# Patient Record
Sex: Female | Born: 1995 | Hispanic: Yes | Marital: Married | Smoking: Never smoker
Health system: Southern US, Community
[De-identification: ages and names within clinical notes are randomized; demographics above are authoritative.]

## PROBLEM LIST (undated history)

## (undated) ENCOUNTER — Inpatient Hospital Stay (HOSPITAL_COMMUNITY): Payer: Self-pay

## (undated) DIAGNOSIS — E063 Autoimmune thyroiditis: Secondary | ICD-10-CM

## (undated) DIAGNOSIS — K529 Noninfective gastroenteritis and colitis, unspecified: Secondary | ICD-10-CM

## (undated) HISTORY — DX: Noninfective gastroenteritis and colitis, unspecified: K52.9

## (undated) HISTORY — DX: Autoimmune thyroiditis: E06.3

---

## 2015-12-26 HISTORY — PX: RHINOPLASTY: SUR1284

## 2017-10-24 DIAGNOSIS — K529 Noninfective gastroenteritis and colitis, unspecified: Secondary | ICD-10-CM

## 2017-10-24 HISTORY — DX: Noninfective gastroenteritis and colitis, unspecified: K52.9

## 2018-06-07 ENCOUNTER — Ambulatory Visit (INDEPENDENT_AMBULATORY_CARE_PROVIDER_SITE_OTHER): Payer: PRIVATE HEALTH INSURANCE

## 2018-06-07 ENCOUNTER — Encounter: Payer: Self-pay | Admitting: *Deleted

## 2018-06-07 VITALS — BP 102/69 | HR 90 | Ht 61.0 in | Wt 155.7 lb

## 2018-06-07 DIAGNOSIS — Z3201 Encounter for pregnancy test, result positive: Secondary | ICD-10-CM | POA: Diagnosis not present

## 2018-06-07 DIAGNOSIS — Z3490 Encounter for supervision of normal pregnancy, unspecified, unspecified trimester: Secondary | ICD-10-CM | POA: Insufficient documentation

## 2018-06-07 LAB — POCT URINE PREGNANCY: Preg Test, Ur: POSITIVE — AB

## 2018-06-07 NOTE — Addendum Note (Signed)
Addended by: Maretta BeesMCGLASHAN, Nashali Ditmer J on: 06/07/2018 09:11 AM   Modules accepted: Level of Service

## 2018-06-07 NOTE — Progress Notes (Signed)
Presents for UPT.  UPT today is POSITIVE LMP 04/12/18  EDD 01/17/2019  [name] presents today for UPT. She has no unusual complaints. LMP: 04/12/18    OBJECTIVE: Appears well, in no apparent distress.  OB History    Gravida  2   Para      Term      Preterm      AB  1   Living  0     SAB      TAB      Ectopic      Multiple      Live Births             Home UPT Result:  Positive In-Office UPT result: POSITIVE I have reviewed the patient's medical, obstetrical, social, and family histories, and medications.   ASSESSMENT: Positive pregnancy test  PLAN Prenatal care to be completed at: [facility].

## 2018-07-01 ENCOUNTER — Ambulatory Visit (INDEPENDENT_AMBULATORY_CARE_PROVIDER_SITE_OTHER): Payer: PRIVATE HEALTH INSURANCE | Admitting: Obstetrics and Gynecology

## 2018-07-01 ENCOUNTER — Encounter: Payer: Self-pay | Admitting: Obstetrics and Gynecology

## 2018-07-01 DIAGNOSIS — Z3481 Encounter for supervision of other normal pregnancy, first trimester: Secondary | ICD-10-CM

## 2018-07-01 DIAGNOSIS — Z8639 Personal history of other endocrine, nutritional and metabolic disease: Secondary | ICD-10-CM

## 2018-07-01 DIAGNOSIS — Z113 Encounter for screening for infections with a predominantly sexual mode of transmission: Secondary | ICD-10-CM | POA: Diagnosis not present

## 2018-07-01 DIAGNOSIS — Z124 Encounter for screening for malignant neoplasm of cervix: Secondary | ICD-10-CM | POA: Diagnosis not present

## 2018-07-01 DIAGNOSIS — Z3490 Encounter for supervision of normal pregnancy, unspecified, unspecified trimester: Secondary | ICD-10-CM

## 2018-07-01 NOTE — Progress Notes (Signed)
Patient is in the office for initial ob visit, married.

## 2018-07-01 NOTE — Progress Notes (Signed)
  Subjective:    Jessica Ho is a G2P0010 Unknown being seen today for her first obstetrical visit and history of hashimoto thyroiditis and colitis.  Her obstetrical history is significant for first pregnancy. Patient does intend to breast feed. Pregnancy history fully reviewed.  Patient reports improving nausea and emesis.  Vitals:   07/01/18 0916  BP: 103/67  Pulse: 80  Weight: 157 lb 8 oz (71.4 kg)    HISTORY: OB History  Gravida Para Term Preterm AB Living  2       1 0  SAB TAB Ectopic Multiple Live Births  1            # Outcome Date GA Lbr Len/2nd Weight Sex Delivery Anes PTL Lv  2 Current           1 SAB 06/21/16 [id number]          Past Medical History:  Diagnosis Date  . Colitis 10/24/2017  . Hashimoto's disease    Past Surgical History:  Procedure Laterality Date  . RHINOPLASTY  12/2015   Family History  Problem Relation Age of Onset  . Hypotension Mother   . Hashimoto's thyroiditis Mother   . Hashimoto's thyroiditis Sister   . Hypertension Maternal Grandmother   . Anemia Maternal Grandmother      Exam    Uterus:   8-weeks  Pelvic Exam:    Perineum: Normal Perineum   Vulva: normal   Vagina:  normal mucosa, normal discharge   pH:    Cervix: nulliparous appearance and cervix is closed and long   Adnexa: no mass, fullness, tenderness   Bony Pelvis: gynecoid  System: Breast:  normal appearance, no masses or tenderness   Skin: normal coloration and turgor, no rashes    Neurologic: oriented, no focal deficits   Extremities: normal strength, tone, and muscle mass   HEENT extra ocular movement intact   Mouth/Teeth mucous membranes moist, pharynx normal without lesions and dental hygiene good   Neck supple and no masses   Cardiovascular: regular rate and rhythm   Respiratory:  appears well, vitals normal, no respiratory distress, acyanotic, normal RR, chest clear, no wheezing, crepitations, rhonchi, normal symmetric air entry   Abdomen: soft, non-tender;  bowel sounds normal; no masses,  no organomegaly   Urinary:       Assessment:    Pregnancy: G2P0010 Patient Active Problem List   Diagnosis Date Noted  . Hx of Hashimoto thyroiditis 07/01/2018  . Supervision of normal pregnancy, antepartum 06/07/2018        Plan:     Initial labs drawn. Prenatal vitamins. Problem list reviewed and updated. Genetic Screening discussed : Panorama deferred until viability ultrasound  Ultrasound discussed; fetal survey: requested. Limited bedside abdominal ultrasound demonstrates an intrauterine gestational sac with yolk sac with possible fetal pole, no cardiac activity clearly visualized. Patient scheduled for viability ultrasound and dating as findings are not consistent with [redacted] weeks gestation Patient reports diagnosed with Hashimoto's thyroiditis a year ago, not on medication- TSH ordered  Follow up in 4 weeks. 50% of 30 min visit spent on counseling and coordination of care.     Jessica Ho 07/01/2018

## 2018-07-01 NOTE — Patient Instructions (Signed)
 First Trimester of Pregnancy The first trimester of pregnancy is from week 1 until the end of week 13 (months 1 through 3). A week after a sperm fertilizes an egg, the egg will implant on the wall of the uterus. This embryo will begin to develop into a baby. Genes from you and your partner will form the baby. The female genes will determine whether the baby will be a boy or a girl. At 6-8 weeks, the eyes and face will be formed, and the heartbeat can be seen on ultrasound. At the end of 12 weeks, all the baby's organs will be formed. Now that you are pregnant, you will want to do everything you can to have a healthy baby. Two of the most important things are to get good prenatal care and to follow your health care provider's instructions. Prenatal care is all the medical care you receive before the baby's birth. This care will help prevent, find, and treat any problems during the pregnancy and childbirth. Body changes during your first trimester Your body goes through many changes during pregnancy. The changes vary from woman to woman.  You may gain or lose a couple of pounds at first.  You may feel sick to your stomach (nauseous) and you may throw up (vomit). If the vomiting is uncontrollable, call your health care provider.  You may tire easily.  You may develop headaches that can be relieved by medicines. All medicines should be approved by your health care provider.  You may urinate more often. Painful urination may mean you have a bladder infection.  You may develop heartburn as a result of your pregnancy.  You may develop constipation because certain hormones are causing the muscles that push stool through your intestines to slow down.  You may develop hemorrhoids or swollen veins (varicose veins).  Your breasts may begin to grow larger and become tender. Your nipples may stick out more, and the tissue that surrounds them (areola) may become darker.  Your gums may bleed and may be  sensitive to brushing and flossing.  Dark spots or blotches (chloasma, mask of pregnancy) may develop on your face. This will likely fade after the baby is born.  Your menstrual periods will stop.  You may have a loss of appetite.  You may develop cravings for certain kinds of food.  You may have changes in your emotions from day to day, such as being excited to be pregnant or being concerned that something may go wrong with the pregnancy and baby.  You may have more vivid and strange dreams.  You may have changes in your hair. These can include thickening of your hair, rapid growth, and changes in texture. Some women also have hair loss during or after pregnancy, or hair that feels dry or thin. Your hair will most likely return to normal after your baby is born.  What to expect at prenatal visits During a routine prenatal visit:  You will be weighed to make sure you and the baby are growing normally.  Your blood pressure will be taken.  Your abdomen will be measured to track your baby's growth.  The fetal heartbeat will be listened to between weeks 10 and 14 of your pregnancy.  Test results from any previous visits will be discussed.  Your health care provider may ask you:  How you are feeling.  If you are feeling the baby move.  If you have had any abnormal symptoms, such as leaking fluid, bleeding, severe   headaches, or abdominal cramping.  If you are using any tobacco products, including cigarettes, chewing tobacco, and electronic cigarettes.  If you have any questions.  Other tests that may be performed during your first trimester include:  Blood tests to find your blood type and to check for the presence of any previous infections. The tests will also be used to check for low iron levels (anemia) and protein on red blood cells (Rh antibodies). Depending on your risk factors, or if you previously had diabetes during pregnancy, you may have tests to check for high blood  sugar that affects pregnant women (gestational diabetes).  Urine tests to check for infections, diabetes, or protein in the urine.  An ultrasound to confirm the proper growth and development of the baby.  Fetal screens for spinal cord problems (spina bifida) and Down syndrome.  HIV (human immunodeficiency virus) testing. Routine prenatal testing includes screening for HIV, unless you choose not to have this test.  You may need other tests to make sure you and the baby are doing well.  Follow these instructions at home: Medicines  Follow your health care provider's instructions regarding medicine use. Specific medicines may be either safe or unsafe to take during pregnancy.  Take a prenatal vitamin that contains at least 600 micrograms (mcg) of folic acid.  If you develop constipation, try taking a stool softener if your health care provider approves. Eating and drinking  Eat a balanced diet that includes fresh fruits and vegetables, whole grains, good sources of protein such as meat, eggs, or tofu, and low-fat dairy. Your health care provider will help you determine the amount of weight gain that is right for you.  Avoid raw meat and uncooked cheese. These carry germs that can cause birth defects in the baby.  Eating four or five small meals rather than three large meals a day may help relieve nausea and vomiting. If you start to feel nauseous, eating a few soda crackers can be helpful. Drinking liquids between meals, instead of during meals, also seems to help ease nausea and vomiting.  Limit foods that are high in fat and processed sugars, such as fried and sweet foods.  To prevent constipation: ? Eat foods that are high in fiber, such as fresh fruits and vegetables, whole grains, and beans. ? Drink enough fluid to keep your urine clear or pale yellow. Activity  Exercise only as directed by your health care provider. Most women can continue their usual exercise routine during  pregnancy. Try to exercise for 30 minutes at least 5 days a week. Exercising will help you: ? Control your weight. ? Stay in shape. ? Be prepared for labor and delivery.  Experiencing pain or cramping in the lower abdomen or lower back is a good sign that you should stop exercising. Check with your health care provider before continuing with normal exercises.  Try to avoid standing for long periods of time. Move your legs often if you must stand in one place for a long time.  Avoid heavy lifting.  Wear low-heeled shoes and practice good posture.  You may continue to have sex unless your health care provider tells you not to. Relieving pain and discomfort  Wear a good support bra to relieve breast tenderness.  Take warm sitz baths to soothe any pain or discomfort caused by hemorrhoids. Use hemorrhoid cream if your health care provider approves.  Rest with your legs elevated if you have leg cramps or low back pain.  If you   develop varicose veins in your legs, wear support hose. Elevate your feet for 15 minutes, 3-4 times a day. Limit salt in your diet. Prenatal care  Schedule your prenatal visits by the twelfth week of pregnancy. They are usually scheduled monthly at first, then more often in the last 2 months before delivery.  Write down your questions. Take them to your prenatal visits.  Keep all your prenatal visits as told by your health care provider. This is important. Safety  Wear your seat belt at all times when driving.  Make a list of emergency phone numbers, including numbers for family, friends, the hospital, and police and fire departments. General instructions  Ask your health care provider for a referral to a local prenatal education class. Begin classes no later than the beginning of month 6 of your pregnancy.  Ask for help if you have counseling or nutritional needs during pregnancy. Your health care provider can offer advice or refer you to specialists for help  with various needs.  Do not use hot tubs, steam rooms, or saunas.  Do not douche or use tampons or scented sanitary pads.  Do not cross your legs for long periods of time.  Avoid cat litter boxes and soil used by cats. These carry germs that can cause birth defects in the baby and possibly loss of the fetus by miscarriage or stillbirth.  Avoid all smoking, herbs, alcohol, and medicines not prescribed by your health care provider. Chemicals in these products affect the formation and growth of the baby.  Do not use any products that contain nicotine or tobacco, such as cigarettes and e-cigarettes. If you need help quitting, ask your health care provider. You may receive counseling support and other resources to help you quit.  Schedule a dentist appointment. At home, brush your teeth with a soft toothbrush and be gentle when you floss. Contact a health care provider if:  You have dizziness.  You have mild pelvic cramps, pelvic pressure, or nagging pain in the abdominal area.  You have persistent nausea, vomiting, or diarrhea.  You have a bad smelling vaginal discharge.  You have pain when you urinate.  You notice increased swelling in your face, hands, legs, or ankles.  You are exposed to fifth disease or chickenpox.  You are exposed to German measles (rubella) and have never had it. Get help right away if:  You have a fever.  You are leaking fluid from your vagina.  You have spotting or bleeding from your vagina.  You have severe abdominal cramping or pain.  You have rapid weight gain or loss.  You vomit blood or material that looks like coffee grounds.  You develop a severe headache.  You have shortness of breath.  You have any kind of trauma, such as from a fall or a car accident. Summary  The first trimester of pregnancy is from week 1 until the end of week 13 (months 1 through 3).  Your body goes through many changes during pregnancy. The changes vary from  woman to woman.  You will have routine prenatal visits. During those visits, your health care provider will examine you, discuss any test results you may have, and talk with you about how you are feeling. This information is not intended to replace advice given to you by your health care provider. Make sure you discuss any questions you have with your health care provider. Document Released: 11/04/2001 Document Revised: 10/22/2016 Document Reviewed: 10/22/2016 [employer] Interactive Patient Education  2018   [employer]   Second Trimester of Pregnancy The second trimester is from week 14 through week 27 (months 4 through 6). The second trimester is often a time when you feel your best. Your body has adjusted to being pregnant, and you begin to feel better physically. Usually, morning sickness has lessened or quit completely, you may have more energy, and you may have an increase in appetite. The second trimester is also a time when the fetus is growing rapidly. At the end of the sixth month, the fetus is about 9 inches long and weighs about 1 pounds. You will likely begin to feel the baby move (quickening) between 16 and 20 weeks of pregnancy. Body changes during your second trimester Your body continues to go through many changes during your second trimester. The changes vary from woman to woman.  Your weight will continue to increase. You will notice your lower abdomen bulging out.  You may begin to get stretch marks on your hips, abdomen, and breasts.  You may develop headaches that can be relieved by medicines. The medicines should be approved by your health care provider.  You may urinate more often because the fetus is pressing on your bladder.  You may develop or continue to have heartburn as a result of your pregnancy.  You may develop constipation because certain hormones are causing the muscles that push waste through your intestines to slow down.  You may develop hemorrhoids or  swollen, bulging veins (varicose veins).  You may have back pain. This is caused by: ? Weight gain. ? Pregnancy hormones that are relaxing the joints in your pelvis. ? A shift in weight and the muscles that support your balance.  Your breasts will continue to grow and they will continue to become tender.  Your gums may bleed and may be sensitive to brushing and flossing.  Dark spots or blotches (chloasma, mask of pregnancy) may develop on your face. This will likely fade after the baby is born.  A dark line from your belly button to the pubic area (linea nigra) may appear. This will likely fade after the baby is born.  You may have changes in your hair. These can include thickening of your hair, rapid growth, and changes in texture. Some women also have hair loss during or after pregnancy, or hair that feels dry or thin. Your hair will most likely return to normal after your baby is born.  What to expect at prenatal visits During a routine prenatal visit:  You will be weighed to make sure you and the fetus are growing normally.  Your blood pressure will be taken.  Your abdomen will be measured to track your baby's growth.  The fetal heartbeat will be listened to.  Any test results from the previous visit will be discussed.  Your health care provider may ask you:  How you are feeling.  If you are feeling the baby move.  If you have had any abnormal symptoms, such as leaking fluid, bleeding, severe headaches, or abdominal cramping.  If you are using any tobacco products, including cigarettes, chewing tobacco, and electronic cigarettes.  If you have any questions.  Other tests that may be performed during your second trimester include:  Blood tests that check for: ? Low iron levels (anemia). ? High blood sugar that affects pregnant women (gestational diabetes) between 24 and 28 weeks. ? Rh antibodies. This is to check for a protein on red blood cells (Rh factor).  Urine  tests to   check for infections, diabetes, or protein in the urine.  An ultrasound to confirm the proper growth and development of the baby.  An amniocentesis to check for possible genetic problems.  Fetal screens for spina bifida and Down syndrome.  HIV (human immunodeficiency virus) testing. Routine prenatal testing includes screening for HIV, unless you choose not to have this test.  Follow these instructions at home: Medicines  Follow your health care provider's instructions regarding medicine use. Specific medicines may be either safe or unsafe to take during pregnancy.  Take a prenatal vitamin that contains at least 600 micrograms (mcg) of folic acid.  If you develop constipation, try taking a stool softener if your health care provider approves. Eating and drinking  Eat a balanced diet that includes fresh fruits and vegetables, whole grains, good sources of protein such as meat, eggs, or tofu, and low-fat dairy. Your health care provider will help you determine the amount of weight gain that is right for you.  Avoid raw meat and uncooked cheese. These carry germs that can cause birth defects in the baby.  If you have low calcium intake from food, talk to your health care provider about whether you should take a daily calcium supplement.  Limit foods that are high in fat and processed sugars, such as fried and sweet foods.  To prevent constipation: ? Drink enough fluid to keep your urine clear or pale yellow. ? Eat foods that are high in fiber, such as fresh fruits and vegetables, whole grains, and beans. Activity  Exercise only as directed by your health care provider. Most women can continue their usual exercise routine during pregnancy. Try to exercise for 30 minutes at least 5 days a week. Stop exercising if you experience uterine contractions.  Avoid heavy lifting, wear low heel shoes, and practice good posture.  A sexual relationship may be continued unless your health  care provider directs you otherwise. Relieving pain and discomfort  Wear a good support bra to prevent discomfort from breast tenderness.  Take warm sitz baths to soothe any pain or discomfort caused by hemorrhoids. Use hemorrhoid cream if your health care provider approves.  Rest with your legs elevated if you have leg cramps or low back pain.  If you develop varicose veins, wear support hose. Elevate your feet for 15 minutes, 3-4 times a day. Limit salt in your diet. Prenatal Care  Write down your questions. Take them to your prenatal visits.  Keep all your prenatal visits as told by your health care provider. This is important. Safety  Wear your seat belt at all times when driving.  Make a list of emergency phone numbers, including numbers for family, friends, the hospital, and police and fire departments. General instructions  Ask your health care provider for a referral to a local prenatal education class. Begin classes no later than the beginning of month 6 of your pregnancy.  Ask for help if you have counseling or nutritional needs during pregnancy. Your health care provider can offer advice or refer you to specialists for help with various needs.  Do not use hot tubs, steam rooms, or saunas.  Do not douche or use tampons or scented sanitary pads.  Do not cross your legs for long periods of time.  Avoid cat litter boxes and soil used by cats. These carry germs that can cause birth defects in the baby and possibly loss of the fetus by miscarriage or stillbirth.  Avoid all smoking, herbs, alcohol, and unprescribed drugs. Chemicals   in these products can affect the formation and growth of the baby.  Do not use any products that contain nicotine or tobacco, such as cigarettes and e-cigarettes. If you need help quitting, ask your health care provider.  Visit your dentist if you have not gone yet during your pregnancy. Use a soft toothbrush to brush your teeth and be gentle when  you floss. Contact a health care provider if:  You have dizziness.  You have mild pelvic cramps, pelvic pressure, or nagging pain in the abdominal area.  You have persistent nausea, vomiting, or diarrhea.  You have a bad smelling vaginal discharge.  You have pain when you urinate. Get help right away if:  You have a fever.  You are leaking fluid from your vagina.  You have spotting or bleeding from your vagina.  You have severe abdominal cramping or pain.  You have rapid weight gain or weight loss.  You have shortness of breath with chest pain.  You notice sudden or extreme swelling of your face, hands, ankles, feet, or legs.  You have not felt your baby move in over an hour.  You have severe headaches that do not go away when you take medicine.  You have vision changes. Summary  The second trimester is from week 14 through week 27 (months 4 through 6). It is also a time when the fetus is growing rapidly.  Your body goes through many changes during pregnancy. The changes vary from woman to woman.  Avoid all smoking, herbs, alcohol, and unprescribed drugs. These chemicals affect the formation and growth your baby.  Do not use any tobacco products, such as cigarettes, chewing tobacco, and e-cigarettes. If you need help quitting, ask your health care provider.  Contact your health care provider if you have any questions. Keep all prenatal visits as told by your health care provider. This is important. This information is not intended to replace advice given to you by your health care provider. Make sure you discuss any questions you have with your health care provider. Document Released: 11/04/2001 Document Revised: 12/16/2016 Document Reviewed: 12/16/2016 [employer] Interactive Patient Education  2018 [employer]   Contraception Choices Contraception, also called birth control, refers to methods or devices that prevent pregnancy. Hormonal methods Contraceptive  implant A contraceptive implant is a thin, plastic tube that contains a hormone. It is inserted into the upper part of the arm. It can remain in place for up to 3 years. Progestin-only injections Progestin-only injections are injections of progestin, a synthetic form of the hormone progesterone. They are given every 3 months by a health care provider. Birth control pills Birth control pills are pills that contain hormones that prevent pregnancy. They must be taken once a day, preferably at the same time each day. Birth control patch The birth control patch contains hormones that prevent pregnancy. It is placed on the skin and must be changed once a week for three weeks and removed on the fourth week. A prescription is needed to use this method of contraception. Vaginal ring A vaginal ring contains hormones that prevent pregnancy. It is placed in the vagina for three weeks and removed on the fourth week. After that, the process is repeated with a new ring. A prescription is needed to use this method of contraception. Emergency contraceptive Emergency contraceptives prevent pregnancy after unprotected sex. They come in pill form and can be taken up to 5 days after sex. They work best the sooner they are taken after having   sex. Most emergency contraceptives are available without a prescription. This method should not be used as your only form of birth control. Barrier methods Female condom A female condom is a thin sheath that is worn over the penis during sex. Condoms keep sperm from going inside a woman's body. They can be used with a spermicide to increase their effectiveness. They should be disposed after a single use. Female condom A female condom is a soft, loose-fitting sheath that is put into the vagina before sex. The condom keeps sperm from going inside a woman's body. They should be disposed after a single use. Diaphragm A diaphragm is a soft, dome-shaped barrier. It is inserted into the vagina  before sex, along with a spermicide. The diaphragm blocks sperm from entering the uterus, and the spermicide kills sperm. A diaphragm should be left in the vagina for 6-8 hours after sex and removed within 24 hours. A diaphragm is prescribed and fitted by a health care provider. A diaphragm should be replaced every 1-2 years, after giving birth, after gaining more than 15 lb (6.8 kg), and after pelvic surgery. Cervical cap A cervical cap is a round, soft latex or plastic cup that fits over the cervix. It is inserted into the vagina before sex, along with spermicide. It blocks sperm from entering the uterus. The cap should be left in place for 6-8 hours after sex and removed within 48 hours. A cervical cap must be prescribed and fitted by a health care provider. It should be replaced every 2 years. Sponge A sponge is a soft, circular piece of polyurethane foam with spermicide on it. The sponge helps block sperm from entering the uterus, and the spermicide kills sperm. To use it, you make it wet and then insert it into the vagina. It should be inserted before sex, left in for at least 6 hours after sex, and removed and thrown away within 30 hours. Spermicides Spermicides are chemicals that kill or block sperm from entering the cervix and uterus. They can come as a cream, jelly, suppository, foam, or tablet. A spermicide should be inserted into the vagina with an applicator at least 10-15 minutes before sex to allow time for it to work. The process must be repeated every time you have sex. Spermicides do not require a prescription. Intrauterine contraception Intrauterine device (IUD) An IUD is a T-shaped device that is put in a woman's uterus. There are two types:  Hormone IUD.This type contains progestin, a synthetic form of the hormone progesterone. This type can stay in place for 3-5 years.  Copper IUD.This type is wrapped in copper wire. It can stay in place for 10 years.  Permanent methods of  contraception Female tubal ligation In this method, a woman's fallopian tubes are sealed, tied, or blocked during surgery to prevent eggs from traveling to the uterus. Hysteroscopic sterilization In this method, a small, flexible insert is placed into each fallopian tube. The inserts cause scar tissue to form in the fallopian tubes and block them, so sperm cannot reach an egg. The procedure takes about 3 months to be effective. Another form of birth control must be used during those 3 months. Female sterilization This is a procedure to tie off the tubes that carry sperm (vasectomy). After the procedure, the man can still ejaculate fluid (semen). Natural planning methods Natural family planning In this method, a couple does not have sex on days when the woman could become pregnant. Calendar method This means keeping track of the   length of each menstrual cycle, identifying the days when pregnancy can happen, and not having sex on those days. Ovulation method In this method, a couple avoids sex during ovulation. Symptothermal method This method involves not having sex during ovulation. The woman typically checks for ovulation by watching changes in her temperature and in the consistency of cervical mucus. Post-ovulation method In this method, a couple waits to have sex until after ovulation. Summary  Contraception, also called birth control, means methods or devices that prevent pregnancy.  Hormonal methods of contraception include implants, injections, pills, patches, vaginal rings, and emergency contraceptives.  Barrier methods of contraception can include female condoms, female condoms, diaphragms, cervical caps, sponges, and spermicides.  There are two types of IUDs (intrauterine devices). An IUD can be put in a woman's uterus to prevent pregnancy for 3-5 years.  Permanent sterilization can be done through a procedure for males, females, or both.  Natural family planning methods involve  not having sex on days when the woman could become pregnant. This information is not intended to replace advice given to you by your health care provider. Make sure you discuss any questions you have with your health care provider. Document Released: 11/10/2005 Document Revised: 12/13/2016 Document Reviewed: 12/13/2016 Elsevier Interactive Patient Education  2018 [employer]   Breastfeeding Choosing to breastfeed is one of the best decisions you can make for yourself and your baby. A change in hormones during pregnancy causes your breasts to make breast milk in your milk-producing glands. Hormones prevent breast milk from being released before your baby is born. They also prompt milk flow after birth. Once breastfeeding has begun, thoughts of your baby, as well as his or her sucking or crying, can stimulate the release of milk from your milk-producing glands. Benefits of breastfeeding Research shows that breastfeeding offers many health benefits for infants and mothers. It also offers a cost-free and convenient way to feed your baby. For your baby  Your first milk (colostrum) helps your baby's digestive system to function better.  Special cells in your milk (antibodies) help your baby to fight off infections.  Breastfed babies are less likely to develop asthma, allergies, obesity, or type 2 diabetes. They are also at lower risk for sudden infant death syndrome (SIDS).  Nutrients in breast milk are better able to meet your baby's needs compared to infant formula.  Breast milk improves your baby's brain development. For you  Breastfeeding helps to create a very special bond between you and your baby.  Breastfeeding is convenient. Breast milk costs nothing and is always available at the correct temperature.  Breastfeeding helps to burn calories. It helps you to lose the weight that you gained during pregnancy.  Breastfeeding makes your uterus return faster to its size before pregnancy.  It also slows bleeding (lochia) after you give birth.  Breastfeeding helps to lower your risk of developing type 2 diabetes, osteoporosis, rheumatoid arthritis, cardiovascular disease, and breast, ovarian, uterine, and endometrial cancer later in life. Breastfeeding basics Starting breastfeeding  Find a comfortable place to sit or lie down, with your neck and back well-supported.  Place a pillow or a rolled-up blanket under your baby to bring him or her to the level of your breast (if you are seated). Nursing pillows are specially designed to help support your arms and your baby while you breastfeed.  Make sure that your baby's tummy (abdomen) is facing your abdomen.  Gently massage your breast. With your fingertips, massage from the outer edges of   your breast inward toward the nipple. This encourages milk flow. If your milk flows slowly, you may need to continue this action during the feeding.  Support your breast with 4 fingers underneath and your thumb above your nipple (make the letter "C" with your hand). Make sure your fingers are well away from your nipple and your baby's mouth.  Stroke your baby's lips gently with your finger or nipple.  When your baby's mouth is open wide enough, quickly bring your baby to your breast, placing your entire nipple and as much of the areola as possible into your baby's mouth. The areola is the colored area around your nipple. ? More areola should be visible above your baby's upper lip than below the lower lip. ? Your baby's lips should be opened and extended outward (flanged) to ensure an adequate, comfortable latch. ? Your baby's tongue should be between his or her lower gum and your breast.  Make sure that your baby's mouth is correctly positioned around your nipple (latched). Your baby's lips should create a seal on your breast and be turned out (everted).  It is common for your baby to suck about 2-3 minutes in order to start the flow of breast  milk. Latching Teaching your baby how to latch onto your breast properly is very important. An improper latch can cause nipple pain, decreased milk supply, and poor weight gain in your baby. Also, if your baby is not latched onto your nipple properly, he or she may swallow some air during feeding. This can make your baby fussy. Burping your baby when you switch breasts during the feeding can help to get rid of the air. However, teaching your baby to latch on properly is still the best way to prevent fussiness from swallowing air while breastfeeding. Signs that your baby has successfully latched onto your nipple  Silent tugging or silent sucking, without causing you pain. Infant's lips should be extended outward (flanged).  Swallowing heard between every 3-4 sucks once your milk has started to flow (after your let-down milk reflex occurs).  Muscle movement above and in front of his or her ears while sucking.  Signs that your baby has not successfully latched onto your nipple  Sucking sounds or smacking sounds from your baby while breastfeeding.  Nipple pain.  If you think your baby has not latched on correctly, slip your finger into the corner of your baby's mouth to break the suction and place it between your baby's gums. Attempt to start breastfeeding again. Signs of successful breastfeeding Signs from your baby  Your baby will gradually decrease the number of sucks or will completely stop sucking.  Your baby will fall asleep.  Your baby's body will relax.  Your baby will retain a small amount of milk in his or her mouth.  Your baby will let go of your breast by himself or herself.  Signs from you  Breasts that have increased in firmness, weight, and size 1-3 hours after feeding.  Breasts that are softer immediately after breastfeeding.  Increased milk volume, as well as a change in milk consistency and color by the fifth day of breastfeeding.  Nipples that are not sore,  cracked, or bleeding.  Signs that your baby is getting enough milk  Wetting at least 1-2 diapers during the first 24 hours after birth.  Wetting at least 5-6 diapers every 24 hours for the first week after birth. The urine should be clear or pale yellow by the age of 5   days.  Wetting 6-8 diapers every 24 hours as your baby continues to grow and develop.  At least 3 stools in a 24-hour period by the age of 5 days. The stool should be soft and yellow.  At least 3 stools in a 24-hour period by the age of 7 days. The stool should be seedy and yellow.  No loss of weight greater than 10% of birth weight during the first 3 days of life.  Average weight gain of 4-7 oz (113-198 g) per week after the age of 4 days.  Consistent daily weight gain by the age of 5 days, without weight loss after the age of 2 weeks. After a feeding, your baby may spit up a small amount of milk. This is normal. Breastfeeding frequency and duration Frequent feeding will help you make more milk and can prevent sore nipples and extremely full breasts (breast engorgement). Breastfeed when you feel the need to reduce the fullness of your breasts or when your baby shows signs of hunger. This is called "breastfeeding on demand." Signs that your baby is hungry include:  Increased alertness, activity, or restlessness.  Movement of the head from side to side.  Opening of the mouth when the corner of the mouth or cheek is stroked (rooting).  Increased sucking sounds, smacking lips, cooing, sighing, or squeaking.  Hand-to-mouth movements and sucking on fingers or hands.  Fussing or crying.  Avoid introducing a pacifier to your baby in the first 4-6 weeks after your baby is born. After this time, you may choose to use a pacifier. Research has shown that pacifier use during the first year of a baby's life decreases the risk of sudden infant death syndrome (SIDS). Allow your baby to feed on each breast as long as he or she  wants. When your baby unlatches or falls asleep while feeding from the first breast, offer the second breast. Because newborns are often sleepy in the first few weeks of life, you may need to awaken your baby to get him or her to feed. Breastfeeding times will vary from baby to baby. However, the following rules can serve as a guide to help you make sure that your baby is properly fed:  Newborns (babies 4 weeks of age or younger) may breastfeed every 1-3 hours.  Newborns should not go without breastfeeding for longer than 3 hours during the day or 5 hours during the night.  You should breastfeed your baby a minimum of 8 times in a 24-hour period.  Breast milk pumping Pumping and storing breast milk allows you to make sure that your baby is exclusively fed your breast milk, even at times when you are unable to breastfeed. This is especially important if you go back to work while you are still breastfeeding, or if you are not able to be present during feedings. Your lactation consultant can help you find a method of pumping that works best for you and give you guidelines about how long it is safe to store breast milk. Caring for your breasts while you breastfeed Nipples can become dry, cracked, and sore while breastfeeding. The following recommendations can help keep your breasts moisturized and healthy:  Avoid using soap on your nipples.  Wear a supportive bra designed especially for nursing. Avoid wearing underwire-style bras or extremely tight bras (sports bras).  Air-dry your nipples for 3-4 minutes after each feeding.  Use only cotton bra pads to absorb leaked breast milk. Leaking of breast milk between feedings is normal.    Use lanolin on your nipples after breastfeeding. Lanolin helps to maintain your skin's normal moisture barrier. Pure lanolin is not harmful (not toxic) to your baby. You may also hand express a few drops of breast milk and gently massage that milk into your nipples and  allow the milk to air-dry.  In the first few weeks after giving birth, some women experience breast engorgement. Engorgement can make your breasts feel heavy, warm, and tender to the touch. Engorgement peaks within 3-5 days after you give birth. The following recommendations can help to ease engorgement:  Completely empty your breasts while breastfeeding or pumping. You may want to start by applying warm, moist heat (in the shower or with warm, water-soaked hand towels) just before feeding or pumping. This increases circulation and helps the milk flow. If your baby does not completely empty your breasts while breastfeeding, pump any extra milk after he or she is finished.  Apply ice packs to your breasts immediately after breastfeeding or pumping, unless this is too uncomfortable for you. To do this: ? Put ice in a plastic bag. ? Place a towel between your skin and the bag. ? Leave the ice on for 20 minutes, 2-3 times a day.  Make sure that your baby is latched on and positioned properly while breastfeeding.  If engorgement persists after 48 hours of following these recommendations, contact your health care provider or a lactation consultant. Overall health care recommendations while breastfeeding  Eat 3 healthy meals and 3 snacks every day. Well-nourished mothers who are breastfeeding need an additional 450-500 calories a day. You can meet this requirement by increasing the amount of a balanced diet that you eat.  Drink enough water to keep your urine pale yellow or clear.  Rest often, relax, and continue to take your prenatal vitamins to prevent fatigue, stress, and low vitamin and mineral levels in your body (nutrient deficiencies).  Do not use any products that contain nicotine or tobacco, such as cigarettes and e-cigarettes. Your baby may be harmed by chemicals from cigarettes that pass into breast milk and exposure to secondhand smoke. If you need help quitting, ask your health care  provider.  Avoid alcohol.  Do not use illegal drugs or marijuana.  Talk with your health care provider before taking any medicines. These include over-the-counter and prescription medicines as well as vitamins and herbal supplements. Some medicines that may be harmful to your baby can pass through breast milk.  It is possible to become pregnant while breastfeeding. If birth control is desired, ask your health care provider about options that will be safe while breastfeeding your baby. Where to find more information: [employer]: www.llli.org Contact a health care provider if:  You feel like you want to stop breastfeeding or have become frustrated with breastfeeding.  Your nipples are cracked or bleeding.  Your breasts are red, tender, or warm.  You have: ? Painful breasts or nipples. ? A swollen area on either breast. ? A fever or chills. ? Nausea or vomiting. ? Drainage other than breast milk from your nipples.  Your breasts do not become full before feedings by the fifth day after you give birth.  You feel sad and depressed.  Your baby is: ? Too sleepy to eat well. ? Having trouble sleeping. ? More than 1 week old and wetting fewer than 6 diapers in a 24-hour period. ? Not gaining weight by 5 days of age.  Your baby has fewer than 3 stools in a   24-hour period.  Your baby's skin or the white parts of his or her eyes become yellow. Get help right away if:  Your baby is overly tired (lethargic) and does not want to wake up and feed.  Your baby develops an unexplained fever. Summary  Breastfeeding offers many health benefits for infant and mothers.  Try to breastfeed your infant when he or she shows early signs of hunger.  Gently tickle or stroke your baby's lips with your finger or nipple to allow the baby to open his or her mouth. Bring the baby to your breast. Make sure that much of the areola is in your baby's mouth. Offer one side and burp the  baby before you offer the other side.  Talk with your health care provider or lactation consultant if you have questions or you face problems as you breastfeed. This information is not intended to replace advice given to you by your health care provider. Make sure you discuss any questions you have with your health care provider. Document Released: 11/10/2005 Document Revised: 12/12/2016 Document Reviewed: 12/12/2016 Elsevier Interactive Patient Education  2018 [employer] Inc.  

## 2018-07-02 LAB — CYTOLOGY - PAP
CHLAMYDIA, DNA PROBE: NEGATIVE
DIAGNOSIS: NEGATIVE
NEISSERIA GONORRHEA: NEGATIVE

## 2018-07-05 ENCOUNTER — Telehealth: Payer: Self-pay

## 2018-07-05 ENCOUNTER — Other Ambulatory Visit: Payer: Self-pay | Admitting: Obstetrics & Gynecology

## 2018-07-05 DIAGNOSIS — B373 Candidiasis of vulva and vagina: Secondary | ICD-10-CM

## 2018-07-05 DIAGNOSIS — B3731 Acute candidiasis of vulva and vagina: Secondary | ICD-10-CM

## 2018-07-05 MED ORDER — TERCONAZOLE 0.8 % VA CREA: 1.0000 | TOPICAL_CREAM | Freq: Every day | VAGINAL | 0 refills | Status: DC

## 2018-07-05 NOTE — Telephone Encounter (Signed)
-----   Message from Tereso NewcomerUgonna A Anyanwu, MD sent at 07/05/2018 12:36 PM EDT ----- Recent pap showed yeast infection. Terazol was prescribed. Please inform patient of results and advise to pick up prescription.

## 2018-07-05 NOTE — Telephone Encounter (Signed)
Patient was notified of results and Rx.

## 2018-07-06 ENCOUNTER — Encounter: Payer: Self-pay | Admitting: Obstetrics & Gynecology

## 2018-07-06 DIAGNOSIS — R8271 Bacteriuria: Secondary | ICD-10-CM | POA: Insufficient documentation

## 2018-07-06 LAB — URINE CULTURE, OB REFLEX

## 2018-07-06 LAB — CULTURE, OB URINE

## 2018-07-07 ENCOUNTER — Other Ambulatory Visit: Payer: Self-pay | Admitting: Obstetrics and Gynecology

## 2018-07-07 ENCOUNTER — Ambulatory Visit (HOSPITAL_COMMUNITY)
Admission: RE | Admit: 2018-07-07 | Discharge: 2018-07-07 | Disposition: A | Discharge status: Home / Self Care | Payer: PRIVATE HEALTH INSURANCE | Source: Ambulatory Visit | Attending: Obstetrics and Gynecology | Admitting: Obstetrics and Gynecology

## 2018-07-07 DIAGNOSIS — Z3491 Encounter for supervision of normal pregnancy, unspecified, first trimester: Secondary | ICD-10-CM | POA: Diagnosis not present

## 2018-07-07 DIAGNOSIS — Z3490 Encounter for supervision of normal pregnancy, unspecified, unspecified trimester: Secondary | ICD-10-CM

## 2018-07-07 DIAGNOSIS — Z3A09 9 weeks gestation of pregnancy: Secondary | ICD-10-CM | POA: Insufficient documentation

## 2018-07-07 NOTE — Progress Notes (Signed)
Dating changed:  8/14 U/S at 4748w1d -> EDD 02/08/2019, not c/w LMP

## 2018-07-10 LAB — HEMOGLOBINOPATHY EVALUATION
HEMOGLOBIN A2 QUANTITATION: 2.2 % (ref 1.8–3.2)
HGB C: 0 %
HGB S: 0 %
HGB VARIANT: 0 %
Hemoglobin F Quantitation: 1 % (ref 0.0–2.0)
Hgb A: 96.8 % (ref 96.4–98.8)

## 2018-07-10 LAB — OBSTETRIC PANEL, INCLUDING HIV
Antibody Screen: NEGATIVE
Basophils Absolute: 0 10*3/uL (ref 0.0–0.2)
Basos: 0 %
EOS (ABSOLUTE): 0.3 10*3/uL (ref 0.0–0.4)
Eos: 3 %
HIV SCREEN 4TH GENERATION: NONREACTIVE
Hematocrit: 39.7 % (ref 34.0–46.6)
Hemoglobin: 12.5 g/dL (ref 11.1–15.9)
Hepatitis B Surface Ag: NEGATIVE
Immature Grans (Abs): 0 10*3/uL (ref 0.0–0.1)
Immature Granulocytes: 0 %
Lymphocytes Absolute: 2.7 10*3/uL (ref 0.7–3.1)
Lymphs: 24 %
MCH: 30 pg (ref 26.6–33.0)
MCHC: 31.5 g/dL (ref 31.5–35.7)
MCV: 95 fL (ref 79–97)
MONOCYTES: 7 %
Monocytes Absolute: 0.8 10*3/uL (ref 0.1–0.9)
NEUTROS ABS: 7.6 10*3/uL — AB (ref 1.4–7.0)
Neutrophils: 66 %
PLATELETS: 300 10*3/uL (ref 150–450)
RBC: 4.17 x10E6/uL (ref 3.77–5.28)
RDW: 12.9 % (ref 12.3–15.4)
RPR Ser Ql: NONREACTIVE
RUBELLA: 2.78 {index} (ref >0.99)
Rh Factor: POSITIVE
WBC: 11.4 10*3/uL — AB (ref 3.4–10.8)

## 2018-07-10 LAB — SMN1 COPY NUMBER ANALYSIS (SMA CARRIER SCREENING)

## 2018-07-10 LAB — CYSTIC FIBROSIS MUTATION 97: Interpretation: NOT DETECTED

## 2018-07-10 LAB — TSH: TSH: 2.94 u[IU]/mL (ref 0.450–4.500)

## 2018-07-29 ENCOUNTER — Ambulatory Visit (INDEPENDENT_AMBULATORY_CARE_PROVIDER_SITE_OTHER): Payer: PRIVATE HEALTH INSURANCE | Admitting: Obstetrics and Gynecology

## 2018-07-29 ENCOUNTER — Encounter: Payer: Self-pay | Admitting: Obstetrics and Gynecology

## 2018-07-29 VITALS — BP 103/68 | HR 92 | Wt 154.3 lb

## 2018-07-29 DIAGNOSIS — R8271 Bacteriuria: Secondary | ICD-10-CM

## 2018-07-29 DIAGNOSIS — Z3490 Encounter for supervision of normal pregnancy, unspecified, unspecified trimester: Secondary | ICD-10-CM

## 2018-07-29 MED ORDER — BUTALBITAL-APAP-CAFFEINE 50-325-40 MG PO TABS: 1.0000 | ORAL_TABLET | Freq: Four times a day (QID) | ORAL | 0 refills | Status: AC | PRN

## 2018-07-29 NOTE — Progress Notes (Signed)
   PRENATAL VISIT NOTE  Subjective:  Jessica Ho is a 22 y.o. G2P0010 at [redacted]w[redacted]d being seen today for ongoing prenatal care.  She is currently monitored for the following issues for this low-risk pregnancy and has Supervision of normal pregnancy, antepartum; Hx of Hashimoto thyroiditis; and Group B streptococcal bacteriuria on their problem list.  Patient reports no complaints.  Contractions: Not present. Vag. Bleeding: None.  Movement: Present. Denies leaking of fluid.   The following portions of the patient's history were reviewed and updated as appropriate: allergies, current medications, past family history, past medical history, past social history, past surgical history and problem list. Problem list updated.  Objective:   Vitals:   07/29/18 0816  BP: 103/68  Pulse: 92  Weight: 154 lb 4.8 oz (70 kg)    Fetal Status: Fetal Heart Rate (bpm): 164   Movement: Present     General:  Alert, oriented and cooperative. Patient is in no acute distress.  Skin: Skin is warm and dry. No rash noted.   Cardiovascular: Normal heart rate noted  Respiratory: Normal respiratory effort, no problems with respiration noted  Abdomen: Soft, gravid, appropriate for gestational age.  Pain/Pressure: Absent     Pelvic: Cervical exam deferred        Extremities: Normal range of motion.  Edema: Trace  Mental Status: Normal mood and affect. Normal behavior. Normal judgment and thought content.   Assessment and Plan:  Pregnancy: G2P0010 at [redacted]w[redacted]d  1. Encounter for supervision of normal pregnancy, antepartum, unspecified gravidity Patient is doing well without complaints Panorama today Anatomy ultrasound ordered  2. Group B streptococcal bacteriuria Will receive prophylaxis in labor  Preterm labor symptoms and general obstetric precautions including but not limited to vaginal bleeding, contractions, leaking of fluid and fetal movement were reviewed in detail with the patient. Please refer to After Visit  Summary for other counseling recommendations.  No follow-ups on file.  No future appointments.  Catalina Antigua, MD

## 2018-08-01 LAB — CULTURE, OB URINE

## 2018-08-01 LAB — URINE CULTURE, OB REFLEX

## 2018-08-04 ENCOUNTER — Encounter: Payer: Self-pay | Admitting: Obstetrics and Gynecology

## 2018-08-11 ENCOUNTER — Telehealth: Payer: Self-pay

## 2018-08-11 NOTE — Telephone Encounter (Signed)
TC to pt regarding message  Pt had concerns for OTC safe Cold Rx.  Pt advised of medications to take per NOB booklet.

## 2018-08-21 ENCOUNTER — Inpatient Hospital Stay (HOSPITAL_BASED_OUTPATIENT_CLINIC_OR_DEPARTMENT_OTHER): Payer: PRIVATE HEALTH INSURANCE

## 2018-08-21 ENCOUNTER — Encounter (HOSPITAL_COMMUNITY): Payer: Self-pay | Admitting: *Deleted

## 2018-08-21 ENCOUNTER — Inpatient Hospital Stay (HOSPITAL_COMMUNITY)
Admission: AD | Admit: 2018-08-21 | Discharge: 2018-08-21 | Disposition: A | Discharge status: Hospice | Payer: PRIVATE HEALTH INSURANCE | Source: Ambulatory Visit | Attending: Family Medicine | Admitting: Family Medicine

## 2018-08-21 DIAGNOSIS — R109 Unspecified abdominal pain: Secondary | ICD-10-CM | POA: Diagnosis not present

## 2018-08-21 DIAGNOSIS — O209 Hemorrhage in early pregnancy, unspecified: Secondary | ICD-10-CM | POA: Insufficient documentation

## 2018-08-21 DIAGNOSIS — R1032 Left lower quadrant pain: Secondary | ICD-10-CM | POA: Diagnosis not present

## 2018-08-21 DIAGNOSIS — O26892 Other specified pregnancy related conditions, second trimester: Secondary | ICD-10-CM

## 2018-08-21 DIAGNOSIS — O4692 Antepartum hemorrhage, unspecified, second trimester: Secondary | ICD-10-CM

## 2018-08-21 DIAGNOSIS — Z3A15 15 weeks gestation of pregnancy: Secondary | ICD-10-CM | POA: Diagnosis not present

## 2018-08-21 LAB — URINALYSIS, ROUTINE W REFLEX MICROSCOPIC
Bacteria, UA: NONE SEEN
Bilirubin Urine: NEGATIVE
Glucose, UA: NEGATIVE mg/dL
Ketones, ur: NEGATIVE mg/dL
Leukocytes, UA: NEGATIVE
Nitrite: NEGATIVE
Protein, ur: NEGATIVE mg/dL
Specific Gravity, Urine: 1.005 (ref 1.005–1.030)
pH: 7 (ref 5.0–8.0)

## 2018-08-21 NOTE — [facility] Note (Signed)
Jessica Ho is a 22 y.o. at [redacted]w[redacted]d here in [facility] reporting: +vaginal bleeding; started this am but spotting. Around 2pm bleeding increased to "day 3-4 of a period like'. Called the on call and they told her to come .  +lower left abdominal pain. Sharp stabbing pain. Started 5 days ago. constant Pain score: 6/10. Has not taken any medications Vitals:   08/21/18 1642  BP: 112/72  Pulse: 86  Resp: 16  Temp: 97.7 F (36.5 C)  SpO2: 100%    FHT:156 Lab orders placed from triage: ua

## 2018-08-21 NOTE — Discharge Instructions (Signed)
Vaginal Bleeding During Pregnancy, Second Trimester °A small amount of bleeding (spotting) from the vagina is relatively common in pregnancy. It usually stops on its own. Various things can cause bleeding or spotting in pregnancy. Some bleeding may be related to the pregnancy, and some may not. Sometimes the bleeding is normal and is not a problem. However, bleeding can also be a sign of something serious. Be sure to tell your health care provider about any vaginal bleeding right away. °Some possible causes of vaginal bleeding during the second trimester include: °· Infection, inflammation, or growths on the cervix. °· The placenta may be partially or completely covering the opening of the cervix inside the uterus (placenta previa). °· The placenta may have separated from the uterus (abruption of the placenta). °· You may be having early (preterm) labor. °· The cervix may not be strong enough to keep a baby inside the uterus (cervical insufficiency). °· Tiny cysts may have developed in the uterus instead of pregnancy tissue (molar pregnancy). ° °Follow these instructions at home: °Watch your condition for any changes. The following actions may help to lessen any discomfort you are feeling: °· Follow your health care provider's instructions for limiting your activity. If your health care provider orders bed rest, you may need to stay in bed and only get up to use the bathroom. However, your health care provider may allow you to continue light activity. °· If needed, make plans for someone to help with your regular activities and responsibilities while you are on bed rest. °· Keep track of the number of pads you use each day, how often you change pads, and how soaked (saturated) they are. Write this down. °· Do not use tampons. Do not douche. °· Do not have sexual intercourse or orgasms until approved by your health care provider. °· If you pass any tissue from your vagina, save the tissue so you can show it to your  health care provider. °· Only take over-the-counter or prescription medicines as directed by your health care provider. °· Do not take aspirin because it can make you bleed. °· Do not exercise or perform any strenuous activities or heavy lifting without your health care provider's permission. °· Keep all follow-up appointments as directed by your health care provider. ° °Contact a health care provider if: °· You have any vaginal bleeding during any part of your pregnancy. °· You have cramps or labor pains. °· You have a fever, not controlled by medicine. °Get help right away if: °· You have severe cramps in your back or belly (abdomen). °· You have contractions. °· You have chills. °· You pass large clots or tissue from your vagina. °· Your bleeding increases. °· You feel light-headed or weak, or you have fainting episodes. °· You are leaking fluid or have a gush of fluid from your vagina. °This information is not intended to replace advice given to you by your health care provider. Make sure you discuss any questions you have with your health care provider. °Document Released: 08/20/2005 Document Revised: 04/17/2016 Document Reviewed: 07/18/2013 °Elsevier Interactive Patient Education © 2018 [employer] ° °

## 2018-08-21 NOTE — MAU Provider Note (Signed)
Chief Complaint: Vaginal Bleeding and Abdominal Pain   First Provider Initiated Contact with Patient 08/21/18 1725     SUBJECTIVE HPI: Jessica Ho is a 22 y.o. G2P0010 at [redacted]w[redacted]d who presents to [facility] reporting vaginal bleeding. Reports seeing pink spotting on toilet paper earlier today. Later in the day became heavier. Didn't wear pad. No clots. Reports LLQ pain that is sharp. Has hx of colitis vs crohns & reports this LLQ pain is normal for her. Has had constipation recently & feels like that has made pain worse. Last BM was this morning. No fever or blood in stool.  No LOF or recent intercourse.   Location: abdomen Quality: sharp Severity: 6/10 on pain scale Duration: 5 days Timing: constant Modifying factors: none Associated signs and symptoms: constipation, vaginal bleeding  Past Medical History:  Diagnosis Date  . Colitis 10/24/2017  . Hashimoto's disease    OB History  Gravida Para Term Preterm AB Living  2       1 0  SAB TAB Ectopic Multiple Live Births  1            # Outcome Date GA Lbr Len/2nd Weight Sex Delivery Anes PTL Lv  2 Current           1 SAB 06/21/16 [redacted]w[redacted]d          Past Surgical History:  Procedure Laterality Date  . RHINOPLASTY  12/2015   Social History   Socioeconomic History  . Marital status: Married    Spouse name: Not on file  . Number of children: Not on file  . Years of education: Not on file  . Highest education level: Not on file  Occupational History  . Not on file  Social Needs  . Financial resource strain: Not on file  . Food insecurity:    Worry: Not on file    Inability: Not on file  . Transportation needs:    Medical: Not on file    Non-medical: Not on file  Tobacco Use  . Smoking status: Never Smoker  . Smokeless tobacco: Never Used  Substance and Sexual Activity  . Alcohol use: Not Currently    Comment: socially  . Drug use: Never  . Sexual activity: Yes    Birth control/protection: None  Lifestyle  .  Physical activity:    Days per week: Not on file    Minutes per session: Not on file  . Stress: Not on file  Relationships  . Social connections:    Talks on phone: Not on file    Gets together: Not on file    Attends religious service: Not on file    Active member of club or organization: Not on file    Attends meetings of clubs or organizations: Not on file    Relationship status: Not on file  . Intimate partner violence:    Fear of current or ex partner: Not on file    Emotionally abused: Not on file    Physically abused: Not on file    Forced sexual activity: Not on file  Other Topics Concern  . Not on file  Social History Narrative  . Not on file   Family History  Problem Relation Age of Onset  . Hypotension Mother   . Hashimoto's thyroiditis Mother   . Hashimoto's thyroiditis Sister   . Hypertension Maternal Grandmother   . Anemia Maternal Grandmother    No current facility-administered medications on file prior to encounter.    Current Outpatient  Medications on File Prior to Encounter  Medication Sig Dispense Refill  . butalbital-acetaminophen-caffeine (FIORICET, ESGIC) 50-325-40 MG tablet Take 1-2 tablets by mouth every 6 (six) hours as needed for headache. 20 tablet 0  . Prenatal Vit-Fe Fumarate-FA (PRENATAL MULTIVITAMIN) TABS tablet Take 1 tablet by mouth daily at 12 noon.    Marland Kitchen terconazole (TERAZOL 3) 0.8 % vaginal cream Place 1 applicator vaginally at bedtime. Apply nightly for three nights. (Patient not taking: Reported on 07/29/2018) 20 g 0   Allergies  Allergen Reactions  . Oregano [Origanum Oil] Hives    I have reviewed patient's Past Medical Hx, Surgical Hx, Family Hx, Social Hx, medications and allergies.   Review of Systems  Constitutional: Negative.   Gastrointestinal: Positive for abdominal pain and constipation. Negative for anal bleeding, blood in stool, diarrhea, nausea and vomiting.  Genitourinary: Positive for vaginal bleeding. Negative for  dysuria and vaginal discharge.    OBJECTIVE Patient Vitals for the past 24 hrs:  BP Temp Temp src Pulse Resp SpO2 Weight  08/21/18 1642 112/72 97.7 F (36.5 C) Oral 86 16 100 % -  08/21/18 1633 - - - - - - 69.9 kg   Constitutional: Well-developed, well-nourished female in no acute distress.  Cardiovascular: normal rate & rhythm, no murmur Respiratory: normal rate and effort. Lung sounds clear throughout GI: Abd soft, non-tender, Pos BS x 4. No guarding or rebound tenderness MS: Extremities nontender, no edema, normal ROM Neurologic: Alert and oriented x 4.  GU:     SPECULUM EXAM: NEFG, physiologic discharge, no blood noted, Cervical ectropion.   BIMANUAL: No CMT. cervix closed    LAB RESULTS Results for orders placed or performed during the hospital encounter of 08/21/18 (from the past 24 hour(s))  Urinalysis, Routine w reflex microscopic     Status: Abnormal   Collection Time: 08/21/18  4:38 PM  Result Value Ref Range   Color, Urine STRAW (A) YELLOW   APPearance CLEAR CLEAR   Specific Gravity, Urine 1.005 1.005 - 1.030   pH 7.0 5.0 - 8.0   Glucose, UA NEGATIVE NEGATIVE mg/dL   Hgb urine dipstick SMALL (A) NEGATIVE   Bilirubin Urine NEGATIVE NEGATIVE   Ketones, ur NEGATIVE NEGATIVE mg/dL   Protein, ur NEGATIVE NEGATIVE mg/dL   Nitrite NEGATIVE NEGATIVE   Leukocytes, UA NEGATIVE NEGATIVE   WBC, UA 0-5 0 - 5 WBC/hpf   Bacteria, UA NONE SEEN NONE SEEN   Squamous Epithelial / LPF 0-5 0 - 5    IMAGING No results found.  MAU COURSE Orders Placed This Encounter  Procedures  . Urinalysis, Routine w reflex microscopic   No orders of the defined types were placed in this encounter.   MDM FHT 156 by doppler No blood on exam & cervix closed Rh positive Abdomen soft & non tender. Afebrile NAD, VSS  ASSESSMENT 1. [redacted] weeks gestation of pregnancy   2. Vaginal bleeding in pregnancy, second trimester   3. Abdominal pain during pregnancy, second trimester      PLAN Discharge home in stable condition. Bleeding precautions  Allergies as of 08/21/2018      Reactions   Oregano [origanum Oil] Hives      Medication List    STOP taking these medications   terconazole 0.8 % vaginal cream Commonly known as:  TERAZOL 3     TAKE these medications   butalbital-acetaminophen-caffeine 50-325-40 MG tablet Commonly known as:  FIORICET, ESGIC Take 1-2 tablets by mouth every 6 (six) hours as needed for headache.  prenatal multivitamin Tabs tablet Take 1 tablet by mouth daily at 12 noon.        Judeth Horn, NP 08/21/2018  5:25 PM

## 2018-08-30 DIAGNOSIS — R8271 Bacteriuria: Secondary | ICD-10-CM

## 2018-08-30 DIAGNOSIS — Z8639 Personal history of other endocrine, nutritional and metabolic disease: Secondary | ICD-10-CM

## 2018-09-16 ENCOUNTER — Other Ambulatory Visit (HOSPITAL_COMMUNITY): Payer: Self-pay | Admitting: *Deleted

## 2018-09-16 ENCOUNTER — Ambulatory Visit (HOSPITAL_COMMUNITY)
Admission: RE | Admit: 2018-09-16 | Discharge: 2018-09-16 | Disposition: A | Discharge status: Home / Self Care | Payer: PRIVATE HEALTH INSURANCE | Source: Ambulatory Visit | Attending: Obstetrics and Gynecology | Admitting: Obstetrics and Gynecology

## 2018-09-16 DIAGNOSIS — O99282 Endocrine, nutritional and metabolic diseases complicating pregnancy, second trimester: Secondary | ICD-10-CM | POA: Diagnosis not present

## 2018-09-16 DIAGNOSIS — Z362 Encounter for other antenatal screening follow-up: Secondary | ICD-10-CM

## 2018-09-16 DIAGNOSIS — Z3A19 19 weeks gestation of pregnancy: Secondary | ICD-10-CM | POA: Diagnosis not present

## 2018-09-16 DIAGNOSIS — E079 Disorder of thyroid, unspecified: Secondary | ICD-10-CM | POA: Diagnosis not present

## 2018-09-16 DIAGNOSIS — O99283 Endocrine, nutritional and metabolic diseases complicating pregnancy, third trimester: Secondary | ICD-10-CM

## 2018-09-16 DIAGNOSIS — Z363 Encounter for antenatal screening for malformations: Secondary | ICD-10-CM | POA: Insufficient documentation

## 2018-09-23 ENCOUNTER — Encounter: Payer: Self-pay | Admitting: Obstetrics and Gynecology

## 2018-09-23 ENCOUNTER — Ambulatory Visit (INDEPENDENT_AMBULATORY_CARE_PROVIDER_SITE_OTHER): Payer: PRIVATE HEALTH INSURANCE | Admitting: Obstetrics and Gynecology

## 2018-09-23 VITALS — BP 92/60 | HR 93 | Wt 156.8 lb

## 2018-09-23 DIAGNOSIS — Z8639 Personal history of other endocrine, nutritional and metabolic disease: Secondary | ICD-10-CM

## 2018-09-23 DIAGNOSIS — R8271 Bacteriuria: Secondary | ICD-10-CM

## 2018-09-23 DIAGNOSIS — Z3490 Encounter for supervision of normal pregnancy, unspecified, unspecified trimester: Secondary | ICD-10-CM

## 2018-09-23 NOTE — Progress Notes (Signed)
   PRENATAL VISIT NOTE  Subjective:  Jessica Ho is a 22 y.o. G2P0010 at [redacted]w[redacted]d being seen today for ongoing prenatal care.  She is currently monitored for the following issues for this low-risk pregnancy and has Supervision of normal pregnancy, antepartum; Hx of Hashimoto thyroiditis; and Group B streptococcal bacteriuria on their problem list.  Patient reports itching on breasts and arms where she has an ezcema outbreak.  Contractions: Not present. Vag. Bleeding: None.  Movement: Present. Denies leaking of fluid.   The following portions of the patient's history were reviewed and updated as appropriate: allergies, current medications, past family history, past medical history, past social history, past surgical history and problem list. Problem list updated.  Objective:   Vitals:   09/23/18 0831  BP: 92/60  Pulse: 93  Weight: 156 lb 12.8 oz (71.1 kg)    Fetal Status: Fetal Heart Rate (bpm): 158   Movement: Present     General:  Alert, oriented and cooperative. Patient is in no acute distress.  Skin: Skin is warm and dry. No rash noted.   Cardiovascular: Normal heart rate noted  Respiratory: Normal respiratory effort, no problems with respiration noted  Abdomen: Soft, gravid, appropriate for gestational age.  Pain/Pressure: Present     Pelvic: Cervical exam deferred        Extremities: Normal range of motion.  Edema: Trace  Mental Status: Normal mood and affect. Normal behavior. Normal judgment and thought content.   Assessment and Plan:  Pregnancy: G2P0010 at [redacted]w[redacted]d  1. Encounter for supervision of normal pregnancy, antepartum, unspecified gravidity  2. Group B streptococcal bacteriuria ppx in labor  3. Hx of Hashimoto thyroiditis Repeat with 28 week labs Repeat growth 10/14/18  4. ezcema Reviewed to use unscented lotions, creams Will send topical steroid if worsens  Preterm labor symptoms and general obstetric precautions including but not limited to vaginal bleeding,  contractions, leaking of fluid and fetal movement were reviewed in detail with the patient. Please refer to After Visit Summary for other counseling recommendations.  Return in about 8 weeks (around 11/18/2018) for OB visit, 3rd trim labs, 2 hr GTT.  Future Appointments  Date Time Provider Department Center  10/14/2018  7:45 AM [facility] [place] 2 [facility]    Conan Bowens, MD

## 2018-09-23 NOTE — Patient Instructions (Signed)

## 2018-10-12 ENCOUNTER — Other Ambulatory Visit: Payer: Self-pay | Admitting: Obstetrics & Gynecology

## 2018-10-12 MED ORDER — TRIAMCINOLONE ACETONIDE 0.1 % EX CREA: TOPICAL_CREAM | CUTANEOUS | 0 refills | Status: DC

## 2018-10-12 NOTE — Progress Notes (Signed)
Triamcinolone cream prescribed.  See med rec.  It may not have been successfully e-prescribed.  RN to call pharmacy and make sure that it successfully went through.  Notify patient that rx was sent in.

## 2018-10-14 ENCOUNTER — Encounter (HOSPITAL_COMMUNITY): Payer: Self-pay

## 2018-10-14 ENCOUNTER — Ambulatory Visit (HOSPITAL_COMMUNITY)
Admission: RE | Admit: 2018-10-14 | Discharge: 2018-10-14 | Disposition: A | Discharge status: Home / Self Care | Payer: PRIVATE HEALTH INSURANCE | Source: Ambulatory Visit | Attending: Obstetrics and Gynecology | Admitting: Obstetrics and Gynecology

## 2018-10-14 ENCOUNTER — Telehealth: Payer: Self-pay

## 2018-10-14 DIAGNOSIS — O99282 Endocrine, nutritional and metabolic diseases complicating pregnancy, second trimester: Secondary | ICD-10-CM | POA: Diagnosis not present

## 2018-10-14 DIAGNOSIS — Z362 Encounter for other antenatal screening follow-up: Secondary | ICD-10-CM | POA: Diagnosis not present

## 2018-10-14 DIAGNOSIS — Z3A23 23 weeks gestation of pregnancy: Secondary | ICD-10-CM | POA: Insufficient documentation

## 2018-10-14 DIAGNOSIS — E079 Disorder of thyroid, unspecified: Secondary | ICD-10-CM | POA: Insufficient documentation

## 2018-10-14 NOTE — Telephone Encounter (Signed)
Contacted pt to advise that we have not received any recent BP readings through babyrx. Pt advised that she still wants to continue with babyscripts and will take BP today, and continue as scheduled.

## 2018-10-14 NOTE — ED Notes (Signed)
Pt reports leaking small amt of clear watery fluid x 2 weeks.

## 2018-11-18 ENCOUNTER — Other Ambulatory Visit: Payer: PRIVATE HEALTH INSURANCE

## 2018-11-18 ENCOUNTER — Ambulatory Visit (INDEPENDENT_AMBULATORY_CARE_PROVIDER_SITE_OTHER): Payer: PRIVATE HEALTH INSURANCE | Admitting: Obstetrics and Gynecology

## 2018-11-18 ENCOUNTER — Encounter: Payer: Self-pay | Admitting: Obstetrics and Gynecology

## 2018-11-18 VITALS — BP 97/68 | HR 106 | Wt 163.0 lb

## 2018-11-18 DIAGNOSIS — Z23 Encounter for immunization: Secondary | ICD-10-CM

## 2018-11-18 DIAGNOSIS — Z113 Encounter for screening for infections with a predominantly sexual mode of transmission: Secondary | ICD-10-CM

## 2018-11-18 DIAGNOSIS — Z8639 Personal history of other endocrine, nutritional and metabolic disease: Secondary | ICD-10-CM

## 2018-11-18 DIAGNOSIS — Z3483 Encounter for supervision of other normal pregnancy, third trimester: Secondary | ICD-10-CM

## 2018-11-18 DIAGNOSIS — B373 Candidiasis of vulva and vagina: Secondary | ICD-10-CM

## 2018-11-18 DIAGNOSIS — R8271 Bacteriuria: Secondary | ICD-10-CM

## 2018-11-18 DIAGNOSIS — Z3480 Encounter for supervision of other normal pregnancy, unspecified trimester: Secondary | ICD-10-CM

## 2018-11-18 DIAGNOSIS — N898 Other specified noninflammatory disorders of vagina: Secondary | ICD-10-CM

## 2018-11-18 MED ORDER — CLOTRIMAZOLE 1 % VA CREA: 1.0000 | TOPICAL_CREAM | Freq: Every day | VAGINAL | 2 refills | Status: AC

## 2018-11-18 NOTE — Progress Notes (Signed)
   PRENATAL VISIT NOTE  Subjective:  Jessica Ho is a 22 y.o. G2P0010 at 2992w2d being seen today for ongoing prenatal care.  She is currently monitored for the following issues for this low-risk pregnancy and has Supervision of normal pregnancy, antepartum; Hx of Hashimoto thyroiditis; and Group B streptococcal bacteriuria on their problem list.  Patient reports no complaints.  Contractions: Not present. Vag. Bleeding: None.  Movement: Present. Denies leaking of fluid. Some clumpy discharge.   The following portions of the patient's history were reviewed and updated as appropriate: allergies, current medications, past family history, past medical history, past social history, past surgical history and problem list. Problem list updated.  Objective:   Vitals:   11/18/18 0829  BP: 97/68  Pulse: (!) 106  Weight: 163 lb (73.9 kg)    Fetal Status: Fetal Heart Rate (bpm): 150   Movement: Present     General:  Alert, oriented and cooperative. Patient is in no acute distress.  Skin: Skin is warm and dry. No rash noted.   Cardiovascular: Normal heart rate noted  Respiratory: Normal respiratory effort, no problems with respiration noted  Abdomen: Soft, gravid, appropriate for gestational age.  Pain/Pressure: Present     Pelvic: Cervical exam deferred        SSE: clumpy white discharge in vault  Extremities: Normal range of motion.     Mental Status: Normal mood and affect. Normal behavior. Normal judgment and thought content.   Assessment and Plan:  Pregnancy: G2P0010 at 5092w2d  1. Supervision of other normal pregnancy, antepartum - Glucose Tolerance, 2 Hours w/1 Hour - CBC - HIV Antibody (routine testing w rflx) - RPR - Tdap today - undecided about contraception  2. Hx of Hashimoto thyroiditis - TSH  3. Group B streptococcal bacteriuria ppx in labor  4. Vaginal discharge Swab sent today Appearance of yeast infection, cream sent to pharmacy  Patient[place] of [place], she is unsure about whether to move with him or stay for the duration of pregnancy. Reviewed risks/benefits, she will consider.   Preterm labor symptoms and general obstetric precautions including but not limited to vaginal bleeding, contractions, leaking of fluid and fetal movement were reviewed in detail with the patient. Please refer to After Visit Summary for other counseling recommendations.  Return in about 4 weeks (around 12/16/2018) for OB visit.  No future appointments.  Conan BowensKelly M Davis, MD

## 2018-11-19 LAB — CERVICOVAGINAL ANCILLARY ONLY
BACTERIAL VAGINITIS: NEGATIVE
Candida vaginitis: POSITIVE — AB
Chlamydia: NEGATIVE
Neisseria Gonorrhea: NEGATIVE
Trichomonas: NEGATIVE

## 2018-11-19 LAB — CBC
Hematocrit: 34 % (ref 34.0–46.6)
Hemoglobin: 11.4 g/dL (ref 11.1–15.9)
MCH: 31.3 pg (ref 26.6–33.0)
MCHC: 33.5 g/dL (ref 31.5–35.7)
MCV: 93 fL (ref 79–97)
PLATELETS: 244 10*3/uL (ref 150–450)
RBC: 3.64 x10E6/uL — ABNORMAL LOW (ref 3.77–5.28)
RDW: 12.7 % (ref 12.3–15.4)
WBC: 12.7 10*3/uL — ABNORMAL HIGH (ref 3.4–10.8)

## 2018-11-19 LAB — GLUCOSE TOLERANCE, 2 HOURS W/ 1HR
GLUCOSE, FASTING: 81 mg/dL (ref 65–91)
Glucose, 1 hour: 117 mg/dL (ref 65–179)
Glucose, 2 hour: 114 mg/dL (ref 65–152)

## 2018-11-19 LAB — TSH: TSH: 2.11 u[IU]/mL (ref 0.450–4.500)

## 2018-11-19 LAB — RPR: RPR Ser Ql: NONREACTIVE

## 2018-11-19 LAB — HIV ANTIBODY (ROUTINE TESTING W REFLEX): HIV Screen 4th Generation wRfx: NONREACTIVE

## 2018-12-02 ENCOUNTER — Other Ambulatory Visit: Payer: Self-pay | Admitting: Obstetrics & Gynecology

## 2018-12-03 ENCOUNTER — Telehealth: Payer: Self-pay

## 2018-12-03 NOTE — Telephone Encounter (Signed)
Contacted pt and left vm to remind her to take BP for babyscripts.

## 2018-12-04 ENCOUNTER — Other Ambulatory Visit: Payer: Self-pay

## 2018-12-04 ENCOUNTER — Inpatient Hospital Stay (HOSPITAL_COMMUNITY)
Admission: AD | Admit: 2018-12-04 | Discharge: 2018-12-04 | Disposition: A | Discharge status: Home / Self Care | Payer: BLUE CROSS/BLUE SHIELD | Source: Ambulatory Visit | Attending: Family Medicine | Admitting: Family Medicine

## 2018-12-04 ENCOUNTER — Encounter (HOSPITAL_COMMUNITY): Payer: Self-pay | Admitting: *Deleted

## 2018-12-04 DIAGNOSIS — Z3A30 30 weeks gestation of pregnancy: Secondary | ICD-10-CM | POA: Diagnosis not present

## 2018-12-04 DIAGNOSIS — O26893 Other specified pregnancy related conditions, third trimester: Secondary | ICD-10-CM | POA: Diagnosis not present

## 2018-12-04 DIAGNOSIS — I959 Hypotension, unspecified: Secondary | ICD-10-CM | POA: Diagnosis not present

## 2018-12-04 DIAGNOSIS — R102 Pelvic and perineal pain: Secondary | ICD-10-CM | POA: Diagnosis not present

## 2018-12-04 DIAGNOSIS — N949 Unspecified condition associated with female genital organs and menstrual cycle: Secondary | ICD-10-CM

## 2018-12-04 DIAGNOSIS — O2653 Maternal hypotension syndrome, third trimester: Secondary | ICD-10-CM

## 2018-12-04 DIAGNOSIS — R109 Unspecified abdominal pain: Secondary | ICD-10-CM | POA: Diagnosis present

## 2018-12-04 LAB — CBC
HCT: 32.7 % — ABNORMAL LOW (ref 36.0–46.0)
Hemoglobin: 10.8 g/dL — ABNORMAL LOW (ref 12.0–15.0)
MCH: 30.7 pg (ref 26.0–34.0)
MCHC: 33 g/dL (ref 30.0–36.0)
MCV: 92.9 fL (ref 80.0–100.0)
NRBC: 0 % (ref 0.0–0.2)
PLATELETS: 224 10*3/uL (ref 150–400)
RBC: 3.52 MIL/uL — ABNORMAL LOW (ref 3.87–5.11)
RDW: 13.5 % (ref 11.5–15.5)
WBC: 12.9 10*3/uL — ABNORMAL HIGH (ref 4.0–10.5)

## 2018-12-04 LAB — URINALYSIS, ROUTINE W REFLEX MICROSCOPIC
Bilirubin Urine: NEGATIVE
Glucose, UA: 50 mg/dL — AB
Hgb urine dipstick: NEGATIVE
KETONES UR: NEGATIVE mg/dL
Leukocytes, UA: NEGATIVE
Nitrite: NEGATIVE
Protein, ur: NEGATIVE mg/dL
Specific Gravity, Urine: 1.012 (ref 1.005–1.030)
pH: 7 (ref 5.0–8.0)

## 2018-12-04 LAB — GLUCOSE, CAPILLARY: Glucose-Capillary: 94 mg/dL (ref 70–99)

## 2018-12-04 NOTE — Discharge Instructions (Signed)
Hypotension °As your heart beats, it forces blood through your body. This force is called blood pressure. If you have hypotension, you have low blood pressure. When your blood pressure is too low, you may not get enough blood to your brain or other parts of your body. This may cause you to feel weak, light-headed, have a fast heartbeat, or even pass out (faint). Low blood pressure may be harmless, or it may cause serious problems. °What are the causes? °· Blood loss. °· Not enough water in the body (dehydration). °· Heart problems. °· Hormone problems. °· Pregnancy. °· A very bad infection. °· Not having enough of certain nutrients. °· Very bad allergic reactions. °· Certain medicines. °What increases the risk? °· Age. The risk increases as you get older. °· Conditions that affect the heart or the brain and spinal cord (central nervous system). °· Taking certain medicines. °· Being pregnant. °What are the signs or symptoms? °· Feeling: °? Weak. °? Light-headed. °? Dizzy. °? Tired (fatigued). °· Blurred vision. °· Fast heartbeat. °· Passing out, in very bad cases. °How is this treated? °· Changing your diet. This may involve eating more salt (sodium) or drinking more water. °· Taking medicines to raise your blood pressure. °· Changing how much you take (the dosage) of some of your medicines. °· Wearing compression stockings. These stockings help to prevent blood clots and reduce swelling in your legs. °In some cases, you may need to go to the hospital for: °· Fluid replacement. This means you will receive fluids through an IV tube. °· Blood replacement. This means you will receive donated blood through an IV tube (transfusion). °· Treating an infection or heart problems, if this applies. °· Monitoring. You may need to be monitored while medicines that you are taking wear off. °Follow these instructions at home: °Eating and drinking ° °· Drink enough fluids to keep your pee (urine) pale yellow. °· Eat a healthy diet.  Follow instructions from your doctor about what you can eat or drink. A healthy diet includes: °? Fresh fruits and vegetables. °? Whole grains. °? Low-fat (lean) meats. °? Low-fat dairy products. °· Eat extra salt only as told. Do not add extra salt to your diet unless your doctor tells you to. °· Eat small meals often. °· Avoid standing up quickly after you eat. °Medicines °· Take over-the-counter and prescription medicines only as told by your doctor. °? Follow instructions from your doctor about changing how much you take of your medicines, if this applies. °? Do not stop or change any of your medicines on your own. °General instructions ° °· Wear compression stockings as told by your doctor. °· Get up slowly from lying down or sitting. °· Avoid hot showers and a lot of heat as told by your doctor. °· Return to your normal activities as told by your doctor. Ask what activities are safe for you. °· Do not use any products that contain nicotine or tobacco, such as cigarettes, e-cigarettes, and chewing tobacco. If you need help quitting, ask your doctor. °· Keep all follow-up visits as told by your doctor. This is important. °Contact a doctor if: °· You throw up (vomit). °· You have watery poop (diarrhea). °· You have a fever for more than 2-3 days. °· You feel more thirsty than normal. °· You feel weak and tired. °Get help right away if: °· You have chest pain. °· You have a fast or uneven heartbeat. °· You lose feeling (have numbness) in any   part of your body.  You cannot move your arms or your legs.  You have trouble talking.  You get sweaty or feel light-headed.  You pass out.  You have trouble breathing.  You have trouble staying awake.  You feel mixed up (confused). Summary  Hypotension is also called low blood pressure. It is when the force of blood pumping through your arteries is too weak.  Hypotension may be harmless, or it may cause serious problems.  Treatment may include changing  your diet and medicines, and wearing compression stockings.  In very bad cases, you may need to go to the hospital. This information is not intended to replace advice given to you by your health care provider. Make sure you discuss any questions you have with your health care provider. Document Released: 02/04/2010 Document Revised: 05/06/2018 Document Reviewed: 05/06/2018 Elsevier Interactive Patient Education  2019 [employer]  Round Ligament Pain  The round ligament is a cord of muscle and tissue that helps support the uterus. It can become a source of pain during pregnancy if it becomes stretched or twisted as the baby grows. The pain usually begins in the second trimester (13-28 weeks) of pregnancy, and it can come and go until the baby is delivered. It is not a serious problem, and it does not cause harm to the baby. Round ligament pain is usually a short, sharp, and pinching pain, but it can also be a dull, lingering, and aching pain. The pain is felt in the lower side of the abdomen or in the groin. It usually starts deep in the groin and moves up to the outside of the hip area. The pain may occur when you:  Suddenly change position, such as quickly going from a sitting to standing position.  Roll over in bed.  Cough or sneeze.  Do physical activity. Follow these instructions at home:   Watch your condition for any changes.  When the pain starts, relax. Then try any of these methods to help with the pain: ? Sitting down. ? Flexing your knees up to your abdomen. ? Lying on your side with one pillow under your abdomen and another pillow between your legs. ? Sitting in a warm bath for 15-20 minutes or until the pain goes away.  Take over-the-counter and prescription medicines only as told by your health care provider.  Move slowly when you sit down or stand up.  Avoid long walks if they cause pain.  Stop or reduce your physical activities if they cause pain.  Keep all  follow-up visits as told by your health care provider. This is important. Contact a health care provider if:  Your pain does not go away with treatment.  You feel pain in your back that you did not have before.  Your medicine is not helping. Get help right away if:  You have a fever or chills.  You develop uterine contractions.  You have vaginal bleeding.  You have nausea or vomiting.  You have diarrhea.  You have pain when you urinate. Summary  Round ligament pain is felt in the lower abdomen or groin. It is usually a short, sharp, and pinching pain. It can also be a dull, lingering, and aching pain.  This pain usually begins in the second trimester (13-28 weeks). It occurs because the uterus is stretching with the growing baby, and it is not harmful to the baby.  You may notice the pain when you suddenly change position, when you cough or sneeze,  or during physical activity.  Relaxing, flexing your knees to your abdomen, lying on one side, or taking a warm bath may help to get rid of the pain.  Get help from your health care provider if the pain does not go away or if you have vaginal bleeding, nausea, vomiting, diarrhea, or painful urination. This information is not intended to replace advice given to you by your health care provider. Make sure you discuss any questions you have with your health care provider. Document Released: 08/19/2008 Document Revised: 04/28/2018 Document Reviewed: 04/28/2018 Elsevier Interactive Patient Education  2019 [employer]   Fetal Movement Counts Patient Name: ________________________________________________ Patient Due Date: ____________________ What is a fetal movement count?  A fetal movement count is the number of times that you feel your baby move during a certain amount of time. This may also be called a fetal kick count. A fetal movement count is recommended for every pregnant woman. You may be asked to start counting fetal  movements as early as week 28 of your pregnancy. Pay attention to when your baby is most active. You may notice your baby's sleep and wake cycles. You may also notice things that make your baby move more. You should do a fetal movement count:  When your baby is normally most active.  At the same time each day. A good time to count movements is while you are resting, after having something to eat and drink. How do I count fetal movements? 1. Find a quiet, comfortable area. Sit, or lie down on your side. 2. Write down the date, the start time and stop time, and the number of movements that you felt between those two times. Take this information with you to your health care visits. 3. For 2 hours, count kicks, flutters, swishes, rolls, and jabs. You should feel at least 10 movements during 2 hours. 4. You may stop counting after you have felt 10 movements. 5. If you do not feel 10 movements in 2 hours, have something to eat and drink. Then, keep resting and counting for 1 hour. If you feel at least 4 movements during that hour, you may stop counting. Contact a health care provider if:  You feel fewer than 4 movements in 2 hours.  Your baby is not moving like he or she usually does. Date: ____________ Start time: ____________ Stop time: ____________ Movements: ____________ Date: ____________ Start time: ____________ Stop time: ____________ Movements: ____________ Date: ____________ Start time: ____________ Stop time: ____________ Movements: ____________ Date: ____________ Start time: ____________ Stop time: ____________ Movements: ____________ Date: ____________ Start time: ____________ Stop time: ____________ Movements: ____________ Date: ____________ Start time: ____________ Stop time: ____________ Movements: ____________ Date: ____________ Start time: ____________ Stop time: ____________ Movements: ____________ Date: ____________ Start time: ____________ Stop time: ____________ Movements:  ____________ Date: ____________ Start time: ____________ Stop time: ____________ Movements: ____________ This information is not intended to replace advice given to you by your health care provider. Make sure you discuss any questions you have with your health care provider. Document Released: 12/10/2006 Document Revised: 07/09/2016 Document Reviewed: 12/20/2015 Elsevier Interactive Patient Education  2019 [employer].

## 2018-12-04 NOTE — MAU Provider Note (Signed)
History     CSN: [id number]  Arrival date and time: 12/04/18 1316   First Provider Initiated Contact with Patient 12/04/18 1405      Chief Complaint  Patient presents with  . Dizziness  . Shortness of Breath  . Abdominal Pain   HPI [name] is a 23 y.o. G2P0010 at [redacted]w[redacted]d who presents to MAU today with complaint of 2 days of abdominal pain worse with movement. She has not taken anything for pain. She tried Tylenol and it helped but she didn't want to continue taking too much Tylenol. She denies bleeding, LOF, fever, vomiting or diarrhea. She has had some nausea. She has also been dizzy intermittently for the last 2 days. She notices it when standing for too long. She states this improves with rest.   OB History    Gravida  2   Para      Term      Preterm      AB  1   Living  0     SAB  1   TAB      Ectopic      Multiple      Live Births              Past Medical History:  Diagnosis Date  . Colitis 10/24/2017  . Hashimoto's disease     Past Surgical History:  Procedure Laterality Date  . RHINOPLASTY  12/2015    Family History  Problem Relation Age of Onset  . Hypotension Mother   . Hashimoto's thyroiditis Mother   . Hashimoto's thyroiditis Sister   . Hypertension Maternal Grandmother   . Anemia Maternal Grandmother     Social History   Tobacco Use  . Smoking status: Never Smoker  . Smokeless tobacco: Never Used  Substance Use Topics  . Alcohol use: Not Currently    Comment: socially  . Drug use: Never    Allergies:  Allergies  Allergen Reactions  . Oregano [Origanum Oil] Hives    No medications prior to admission.    Review of Systems  Constitutional: Negative for fever.  Gastrointestinal: Positive for abdominal pain. Negative for constipation, diarrhea, nausea and vomiting.  Genitourinary: Negative for dysuria, frequency, urgency, vaginal bleeding and vaginal discharge.  Neurological: Positive for dizziness. Negative  for syncope.   Physical Exam   Blood pressure (!) 104/58, pulse 84, temperature 98.1 F (36.7 C), temperature source Oral, resp. rate 20, weight 76 kg, last menstrual period 04/12/2018, SpO2 99 %.  Physical Exam  Nursing note and vitals reviewed. Constitutional: She is oriented to person, place, and time. She appears well-developed and well-nourished. No distress.  HENT:  Head: Normocephalic and atraumatic.  Cardiovascular: Normal rate.  Respiratory: Effort normal.  GI: Soft. She exhibits no distension and no mass. There is no abdominal tenderness. There is no rebound and no guarding.  Genitourinary:    Genitourinary Comments: Patient declined   Neurological: She is alert and oriented to person, place, and time.  Skin: Skin is warm and dry. No erythema.  Psychiatric: She has a normal mood and affect.     Results for orders placed or performed during the hospital encounter of 12/04/18 (from the past 24 hour(s))  Urinalysis, Routine w reflex microscopic     Status: Abnormal   Collection Time: 12/04/18  1:44 PM  Result Value Ref Range   Color, Urine YELLOW YELLOW   APPearance TURBID (A) CLEAR   Specific Gravity, Urine 1.012 1.005 - 1.030  pH 7.0 5.0 - 8.0   Glucose, UA 50 (A) NEGATIVE mg/dL   Hgb urine dipstick NEGATIVE NEGATIVE   Bilirubin Urine NEGATIVE NEGATIVE   Ketones, ur NEGATIVE NEGATIVE mg/dL   Protein, ur NEGATIVE NEGATIVE mg/dL   Nitrite NEGATIVE NEGATIVE   Leukocytes, UA NEGATIVE NEGATIVE   Bacteria, UA RARE (A) NONE SEEN   Squamous Epithelial / LPF 0-5 0 - 5   Mucus PRESENT   Glucose, capillary     Status: None   Collection Time: 12/04/18  2:20 PM  Result Value Ref Range   Glucose-Capillary 94 70 - 99 mg/dL  CBC     Status: Abnormal   Collection Time: 12/04/18  2:33 PM  Result Value Ref Range   WBC 12.9 (H) 4.0 - 10.5 K/uL   RBC 3.52 (L) 3.87 - 5.11 MIL/uL   Hemoglobin 10.8 (L) 12.0 - 15.0 g/dL   HCT 16.132.7 (L) 09.636.0 - 04.546.0 %   MCV 92.9 80.0 - 100.0 fL    MCH 30.7 26.0 - 34.0 pg   MCHC 33.0 30.0 - 36.0 g/dL   RDW 40.913.5 81.111.5 - 91.415.5 %   Platelets 224 150 - 400 K/uL   nRBC 0.0 0.0 - 0.2 %    Patient Vitals for the past 24 hrs:  BP Temp Temp src Pulse Resp SpO2 Weight  12/04/18 1535 (!) 104/58 - - 84 - - -  12/04/18 1450 - - - - - 99 % -  12/04/18 1445 - - - - - 98 % -  12/04/18 1440 - - - - - 97 % -  12/04/18 1435 - - - - - 98 % -  12/04/18 1430 - - - - - 98 % -  12/04/18 1425 104/60 - - 95 - 96 % -  12/04/18 1422 102/61 - - 96 - - -  12/04/18 1420 102/61 - - 96 - - -  12/04/18 1400 - - - - - 97 % -  12/04/18 1355 - - - - - 98 % -  12/04/18 1335 107/62 98.1 F (36.7 C) Oral 97 20 97 % 76 kg  12/04/18 1333 - - - - - 98 % -    Fetal Monitoring: Baseline: 135 bpm Variability: moderate Accelerations: 10 x 10, 15 x 15 Decelerations: none Contractions: none   MAU Course  Procedures None  MDM UA, CBC and CBG today  Orthostatic VS - normal   Assessment and Plan  A: SIUP at 7431w4d Maternal hypotension Round ligament pain   P: Discharge home Tylenol PRN for pain advised  Abdominal binder Increased PO hydration advised  Preterm labor precautions discussed Patient advised to follow-up with [facility] as scheduled  Patient may return to MAU as needed or if her condition were to change or worsen  Vonzella NippleJulie Wenzel, PA-C 12/04/2018, 6:15 PM

## 2018-12-04 NOTE — MAU Note (Signed)
Having shortness of breath and having a lot of pain in RLQ. Pain is constant, then will get a sharp  Pain and she can't move her leg. These started 2 days.  Wakes her up in the middle of the night.    Has been dizzy, ringing in ears and then feels like she is going to faint.

## 2018-12-07 ENCOUNTER — Other Ambulatory Visit: Payer: Self-pay

## 2018-12-16 ENCOUNTER — Encounter: Payer: Self-pay | Admitting: Certified Nurse Midwife

## 2018-12-16 ENCOUNTER — Ambulatory Visit (INDEPENDENT_AMBULATORY_CARE_PROVIDER_SITE_OTHER): Payer: BLUE CROSS/BLUE SHIELD | Admitting: Certified Nurse Midwife

## 2018-12-16 VITALS — BP 106/69 | HR 118 | Wt 167.0 lb

## 2018-12-16 DIAGNOSIS — R8271 Bacteriuria: Secondary | ICD-10-CM

## 2018-12-16 DIAGNOSIS — L309 Dermatitis, unspecified: Secondary | ICD-10-CM

## 2018-12-16 DIAGNOSIS — Z3480 Encounter for supervision of other normal pregnancy, unspecified trimester: Secondary | ICD-10-CM

## 2018-12-16 DIAGNOSIS — Z3483 Encounter for supervision of other normal pregnancy, third trimester: Secondary | ICD-10-CM

## 2018-12-16 MED ORDER — TRIAMCINOLONE ACETONIDE 0.1 % EX CREA: TOPICAL_CREAM | CUTANEOUS | 0 refills | Status: AC

## 2018-12-16 NOTE — Progress Notes (Signed)
   PRENATAL VISIT NOTE  Subjective:  Jessica Ho is a 23 y.o. G2P0010 at [redacted]w[redacted]d being seen today for ongoing prenatal care.  She is currently monitored for the following issues for this low-risk pregnancy and has Supervision of normal pregnancy, antepartum; Hx of Hashimoto thyroiditis; Group B streptococcal bacteriuria; and Eczema of both upper extremities on their problem list.  Patient reports no complaints.  Contractions: Irritability. Vag. Bleeding: None.  Movement: Present. Denies leaking of fluid.   The following portions of the patient's history were reviewed and updated as appropriate: allergies, current medications, past family history, past medical history, past social history, past surgical history and problem list. Problem list updated.  Objective:   Vitals:   12/16/18 0817  BP: 106/69  Pulse: (!) 118  Weight: 167 lb (75.8 kg)    Fetal Status: Fetal Heart Rate (bpm): 152 Fundal Height: 31 cm Movement: Present  Presentation: Vertex  General:  Alert, oriented and cooperative. Patient is in no acute distress.  Skin: Skin is warm and dry. No rash noted.   Cardiovascular: Normal heart rate noted  Respiratory: Normal respiratory effort, no problems with respiration noted  Abdomen: Soft, gravid, appropriate for gestational age.  Pain/Pressure: Present     Pelvic: Cervical exam deferred        Extremities: Normal range of motion.  Edema: Trace  Mental Status: Normal mood and affect. Normal behavior. Normal judgment and thought content.   Assessment and Plan:  Pregnancy: G2P0010 at [redacted]w[redacted]d  1. Supervision of other normal pregnancy, antepartum - Patient doing well, no complaints - Anticipatory guidance on upcoming appointments  - Routine prenatal care - Patient reports husband is being transferred to [facility] for active duty and has to move with him. She is moving 12/29/2018 and discussed having prenatal appointment prior to move, obtaining printed prenatal records so that care can  be established in [place].   2. Group B streptococcal bacteriuria - needs treatment in labor   3. Eczema of both upper extremities - Eczema present on creases at elbow and knees - Discussed use of unscented lotions and soaps, Rx for refill sent to pharmacy  - triamcinolone cream (KENALOG) 0.1 %; Apply to affected areas twice a day for one week then once a day for a week then as needed.  Dispense: 30 g; Refill: 0  Preterm labor symptoms and general obstetric precautions including but not limited to vaginal bleeding, contractions, leaking of fluid and fetal movement were reviewed in detail with the patient. Please refer to After Visit Summary for other counseling recommendations.  Return in about 11 days (around 12/27/2018) for ROB.  Future Appointments  Date Time Provider Department Center  12/28/2018  8:30 AM Adam Phenix, MD [facility] None    Sharyon Cable, CNM

## 2018-12-16 NOTE — Progress Notes (Signed)
CC: Skin irritation pt states she has not been able to get Rx refilled (when she called for Refills)and Sx's have returned.   Patent will be moving to [facility] in 2 wks.  Husband will begin active Duty.  Pt states she was told last visit that [place] would determine if she was cleared to leave for [facility].

## 2018-12-28 ENCOUNTER — Ambulatory Visit (INDEPENDENT_AMBULATORY_CARE_PROVIDER_SITE_OTHER): Payer: BLUE CROSS/BLUE SHIELD | Admitting: Obstetrics & Gynecology

## 2018-12-28 DIAGNOSIS — Z3483 Encounter for supervision of other normal pregnancy, third trimester: Secondary | ICD-10-CM

## 2018-12-28 DIAGNOSIS — Z3480 Encounter for supervision of other normal pregnancy, unspecified trimester: Secondary | ICD-10-CM

## 2018-12-28 NOTE — Progress Notes (Signed)
Patient reports good fetal movement, denies pain. Pt is moving to [place] today.

## 2018-12-28 NOTE — Progress Notes (Signed)
   PRENATAL VISIT NOTE  Subjective:  Jessica Ho is a 23 y.o. G2P0010 at [redacted]w[redacted]d being seen today for ongoing prenatal care.  She is currently monitored for the following issues for this low-risk pregnancy and has Supervision of normal pregnancy, antepartum; Hx of Hashimoto thyroiditis; Group B streptococcal bacteriuria; and Eczema of both upper extremities on their problem list.  Patient reports no complaints.  Contractions: Not present. Vag. Bleeding: None.  Movement: Present. Denies leaking of fluid.   The following portions of the patient's history were reviewed and updated as appropriate: allergies, current medications, past family history, past medical history, past social history, past surgical history and problem list. Problem list updated.  Objective:   Vitals:   12/28/18 0844  BP: 117/68  Pulse: 94  Weight: 168 lb 6.4 oz (76.4 kg)    Fetal Status: Fetal Heart Rate (bpm): 142 Fundal Height: 34 cm Movement: Present     General:  Alert, oriented and cooperative. Patient is in no acute distress.  Skin: Skin is warm and dry. No rash noted.   Cardiovascular: Normal heart rate noted  Respiratory: Normal respiratory effort, no problems with respiration noted  Abdomen: Soft, gravid, appropriate for gestational age.  Pain/Pressure: Absent     Pelvic: Cervical exam deferred        Extremities: Normal range of motion.  Edema: Trace  Mental Status: Normal mood and affect. Normal behavior. Normal judgment and thought content.   Assessment and Plan:  Pregnancy: G2P0010 at [redacted]w[redacted]d  1. Supervision of other normal pregnancy, antepartum Moving to [facility]  Preterm labor symptoms and general obstetric precautions including but not limited to vaginal bleeding, contractions, leaking of fluid and fetal movement were reviewed in detail with the patient. Please refer to After Visit Summary for other counseling recommendations.  Return if symptoms worsen or fail to improve, for she is moving to  [place]. Info on travel in pregnancy given  No future appointments.  Scheryl Darter, MD

## 2018-12-28 NOTE — Patient Instructions (Signed)
Pregnancy and Travel  Most pregnant women can safely travel until the last month of their pregnancy. Your doctor may recommend limiting or avoiding travel depending on how far you are in the pregnancy, and if you have any medical or pregnancy problems. General travel tips Before you go:  Discuss your trip with your doctor. Get examined shortly before you go.  Get a copy of your medical records. Take it with you.  Try to get names of doctors and hospitals in the area where you will be visiting.  Pack your pillow.  Pack any approved medicines and supplements.  Get enough sleep the night before the trip. During your trip:  Ask for locations of doctors and hospitals.  Wear flat, comfortable shoes.  Wear loose-fitting, comfortable clothes.  Wear compression stockings as told by your doctor. They may prevent blood clots that arise from sitting for a long time.  Do leg exercises as told by your doctor.  Eat a balanced diet, drink lots of fluid, and take your vitamins and supplements.  Take water, crackers, and fruit with you.  Take breaks to use the restroom and walk every 2 hours or during stops.  Do not wear yourself out.  Do not ride on a motorcycle.  Rest. If your trip is long, lie down for 30 or more minutes with your feet slightly raised after you reach your destination.  Always wear a seat belt. Tips for traveling to a foreign country Before you go:  Ask your doctor if there are medicines that are safe for you to take if you get diarrhea, constipation, nausea, or vomiting.  Check with your health insurance provider about medical coverage abroad. Purchase travel [employer], if needed.  Make sure you are up to date on vaccines. During your trip:  Do not eat uncooked foods.  Do not eat food from buffets or food that is cold or sitting at room temperature.  Drink bottled beverages and water. Do not use ice.  Wash fruits and vegetables with clean water. If  possible, peel them before eating.  Do not drink unpasteurized milk.  Wear insect repellent if there are mosquitoes or other biting insects. Ask your doctor which repellents are safe. What do I need to know about traveling by car?  Wear your seat belt properly. The belt should be buckled below your abdomen, on your hip bones. The shoulder belt should be off to the side of your abdomen and across the center of your chest.  If you are in the front seat, sit as far away from the dashboard as possible to avoid getting hit hard if the airbag deploys in an accident.  Do not travel for more than 5-6 hours a day. What do I need to know about traveling by bus?  Before making a reservation, ask whether your bus will have a restroom.  Move your arms and legs when seated.  If you have to use the restroom, hold on to the seats and handrails as you walk.  Do not travel for more than 5-6 hours a day. What do I need to know about traveling by train?  Before making a reservation, ask if your train will have a sleeping car and more than one restroom.  If you need to walk while the train is moving, hold on to seats and handrails.  Move your arms and legs when seated.  Do not travel for more than 5-6 hours a day. What do I need to know about traveling  one restroom.   If you need to walk while the train is moving, hold on to seats and handrails.   Move your arms and legs when seated.   Do not travel for more than 5-6 hours a day.  What do I need to know about traveling by airplane?   Before booking your trip, ask about the airline's rules about pregnancy. Pregnant women may be restricted from flying after a certain time of the pregnancy. Every airline has its own rules.   Make sure you complete your trip before 36 weeks of pregnancy.   Ask whether the airplane cabin will be pressurized. Do not board an unpressurized plane that will fly above 7,000 ft (2,100 m).   Try to get a bulkhead or an aisle seat so it is easier to get up, stretch, and use the bathroom.   Wear layers since the cabin temperature can change.   Put all your medicines and medical records in your carry-on bag.   Avoid drinking caffeinated or  carbonated beverages.   Avoid eating foods that may make you bloated.   Do not eat a big meal.   If you need to walk through the airplane, hold on to the seats and handrails.   Move your arms and legs when seated.   Wear your seat belt.  What do I need to know about traveling by cruise ship?   Before booking your trip, ask the cruise ship company:  ? Are pregnant women allowed on the ship?  ? Is there a medical facility and doctor on board?  ? Does the ship dock in places where there are doctors and medical facilities?   Before booking your trip, ask your doctor:  ? Is it safe to take medicines if I get seasick?  ? Is it safe to wear acupressure wristbands to prevent seasickness? If the answer is yes, consider buying one.  Contact a health care provider if:   You have diarrhea.   You vomit.   You have nausea or seasickness.  Get help right away if:   You have vaginal bleeding.   You have severe vomiting or diarrhea.   You have pelvic or abdominal pain.   You have contractions.   Your water breaks.   You have a persistent headache.   Your eyesight changes or you see spots.   Your face or hands are swollen.   You have pain, warmth, or swelling in your legs or ankles.  Summary   Most pregnant women can safely travel until the last month of their pregnancy. Your doctor may tell you to limit or avoid travel depending on how far you are in your pregnancy and if you have any medical or pregnancy problems.   The best time to travel is between 14 and 28 weeks of your pregnancy.   Before you go on your trip, make sure you discuss your trip with your health care provider, get a copy of your medical records, and try to get information on medical centers and doctors at your destination.   While on your trip, make sure you wear comfortable clothes and shoes, eat a healthy diet, drink plenty of fluids, take your vitamins and supplements, take breaks and rest often, and wear your seat belt.   Before booking  a flight, ask about the airline's rules about pregnancy. Every airline has its own rules. Do the same for a train, bus, or cruise ship.  This information is not intended to replace advice

## 2019-05-03 ENCOUNTER — Encounter (HOSPITAL_COMMUNITY): Payer: Self-pay

## 2019-05-06 IMAGING — US US OB COMP LESS 14 WK
1 series · 15 of 28 positions shown · non-contrast
Comparison: None.

CLINICAL DATA: Unsure of LMP.

EXAM:
OBSTETRIC <14 WK ULTRASOUND
TECHNIQUE: Transabdominal ultrasound was performed for evaluation of the
gestation as well as the maternal uterus and adnexal regions.

[Series 1: us ob comp less 14 wk · 35 acquisitions, 15 frames shown]
[im 1/35]
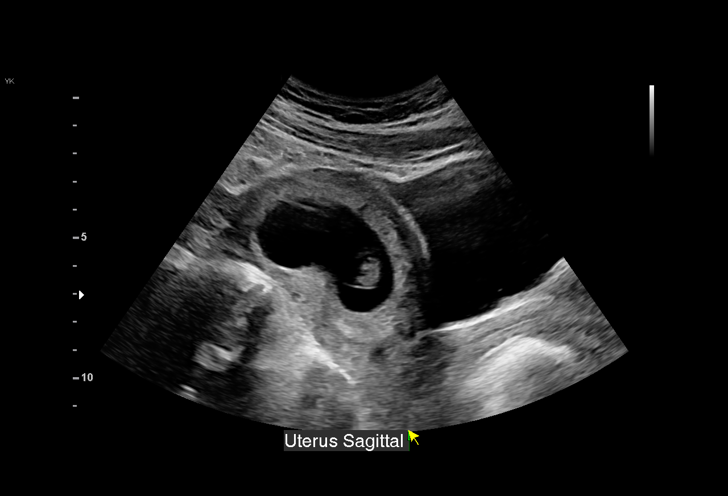
[im 3/35]
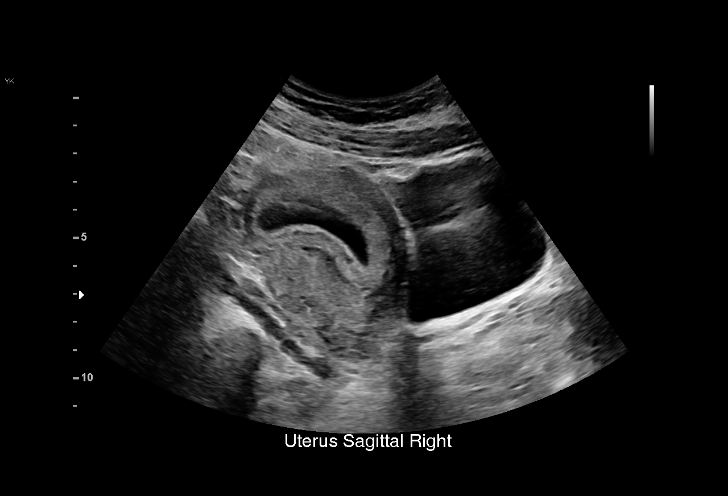
[im 6/35]
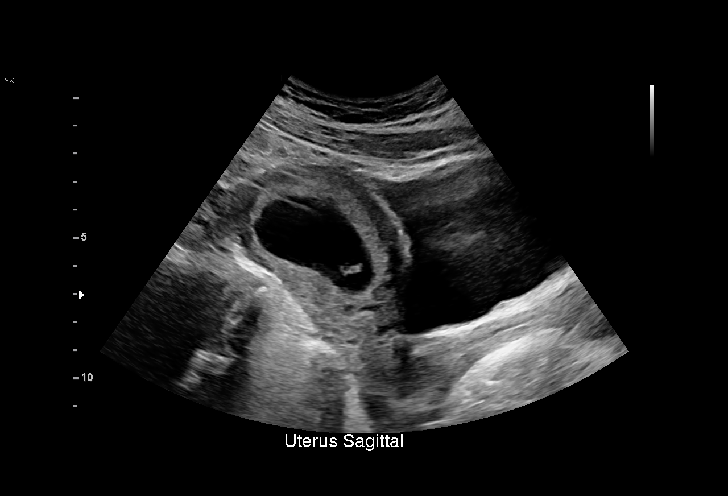
[im 8/35]
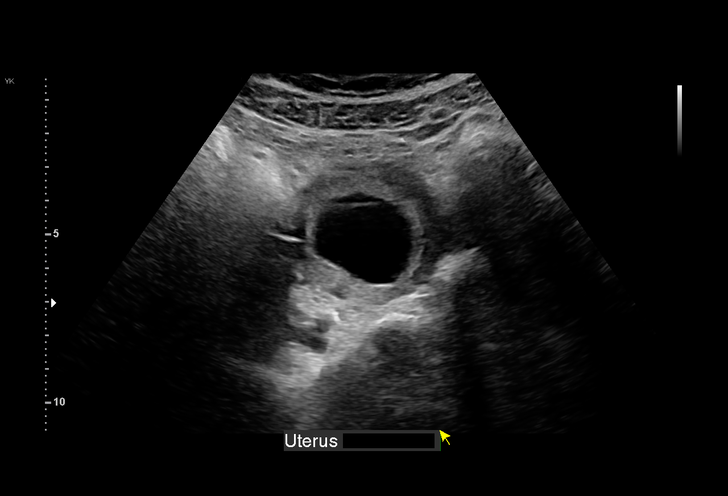
[im 11/35]
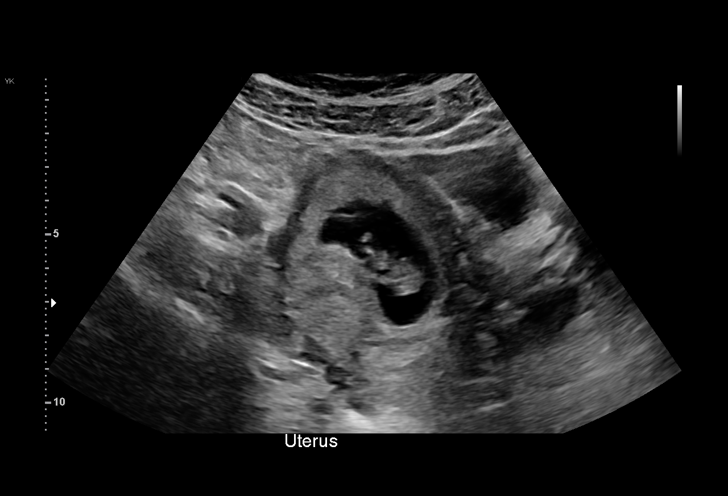
[im 13/35]
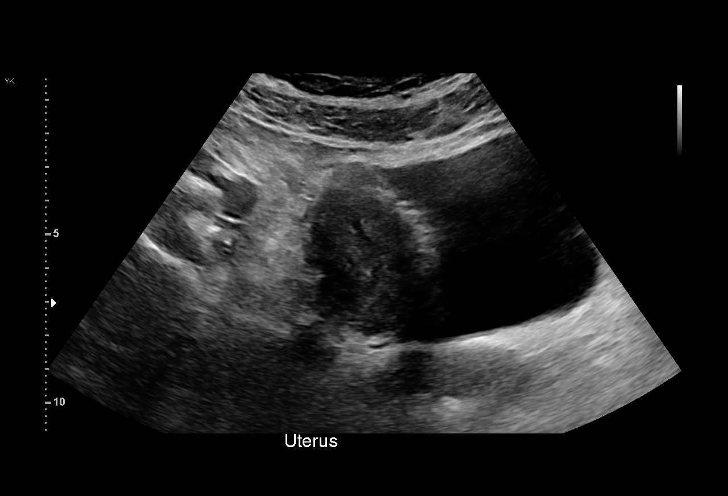
[im 16/35]
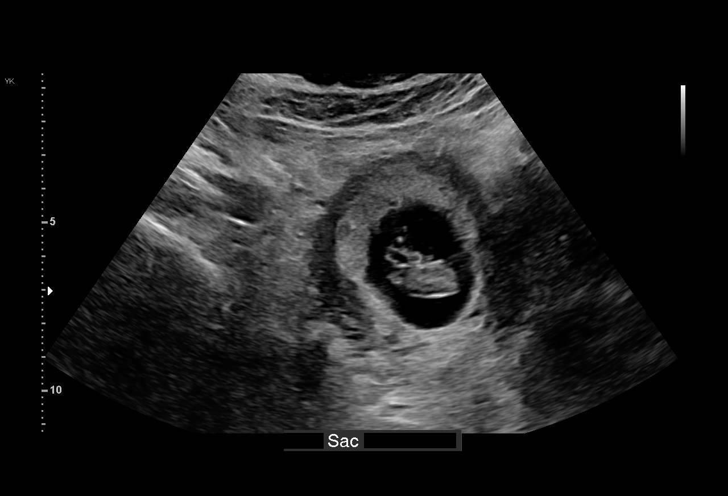
[im 18/35]
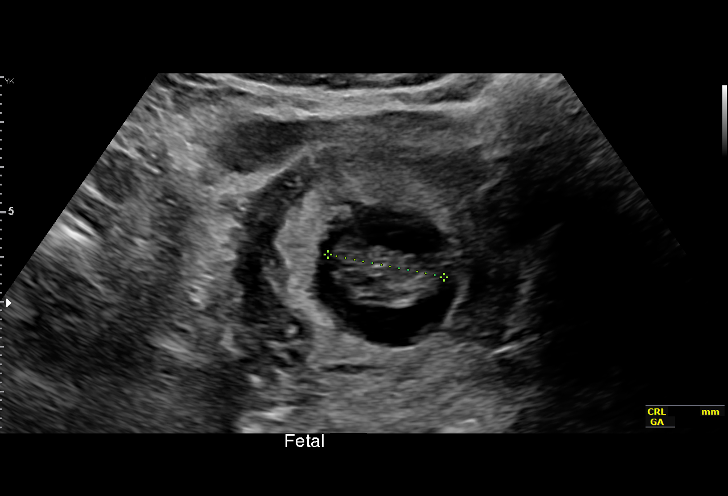
[im 19/35]
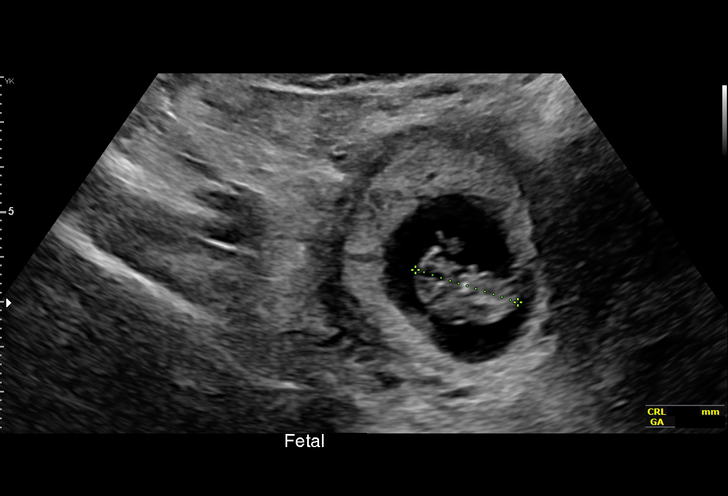
[im 22/35]
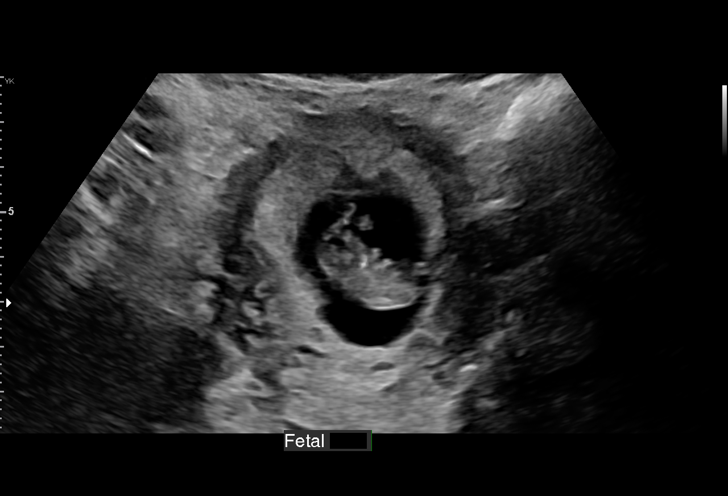
[im 24/35]
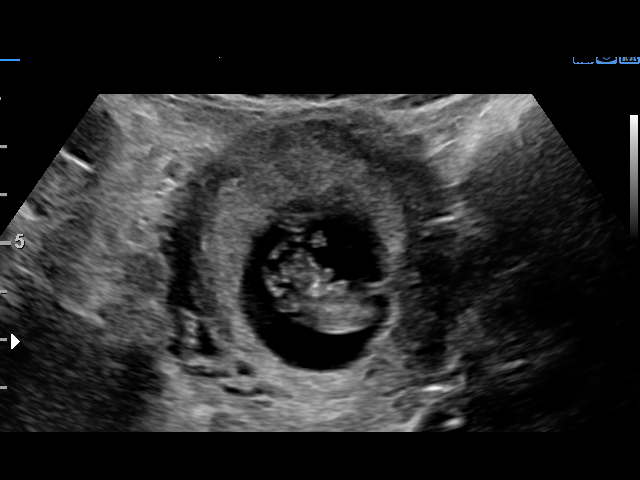
[im 27/35]
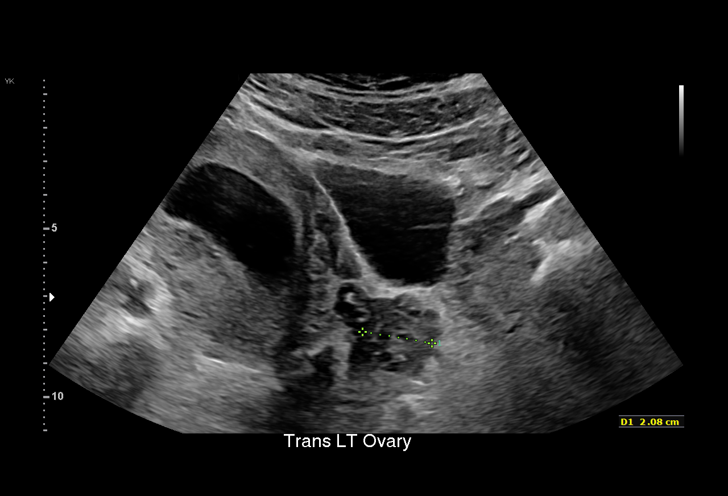
[im 29/35]
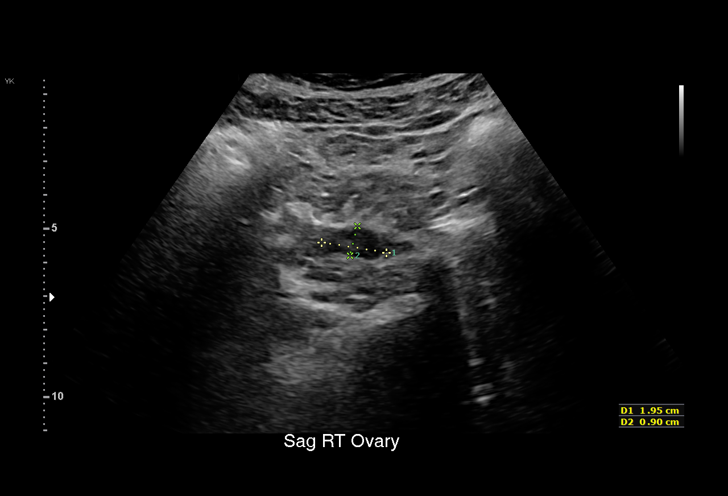
[im 32/35]
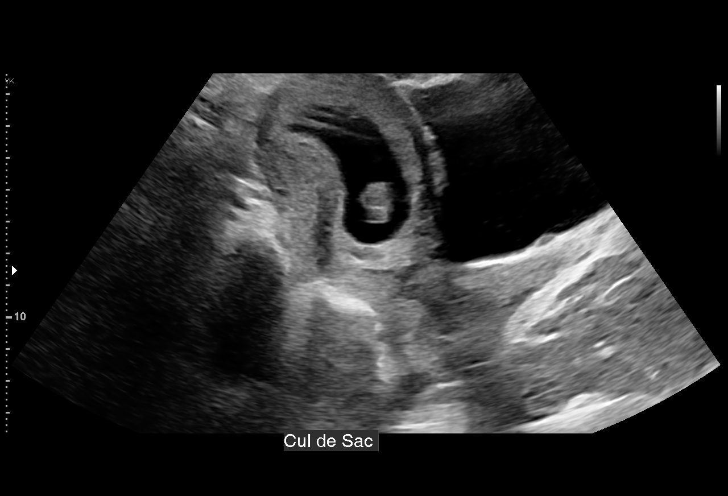
[im 35/35]
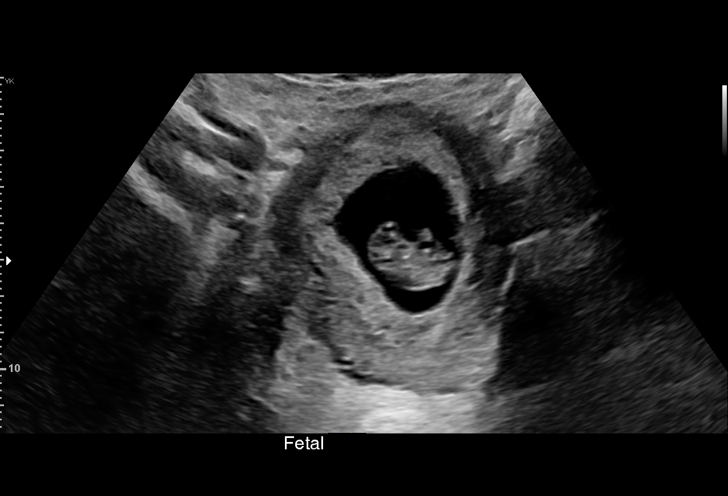

[15 of 28 positions shown; findings below may reference images not displayed]

FINDINGS: Intrauterine gestational sac: Single

Yolk sac:  Visualized.

Embryo:  Visualized.

Cardiac Activity: Visualized.

Heart Rate: 175 bpm

CRL:   25 mm   9 w 1 d                  US EDC: 02/08/2019

Subchorionic hemorrhage:  None visualized.

Maternal uterus/adnexae: Normal appearance of both ovaries. No mass
or abnormal free fluid identified.
IMPRESSION: Single living IUP measuring 9 weeks 1 day, with US EDC of
02/08/2019.

No significant maternal uterine or adnexal abnormality identified.

## 2019-07-16 IMAGING — US US MFM OB COMP +14 WKS
1 series · 14 of 28 positions shown · non-contrast
Comparison: none

[Series 1: us mfm ob comp +14 wks · 65 acquisitions, 14 frames shown]
[im 3/65]
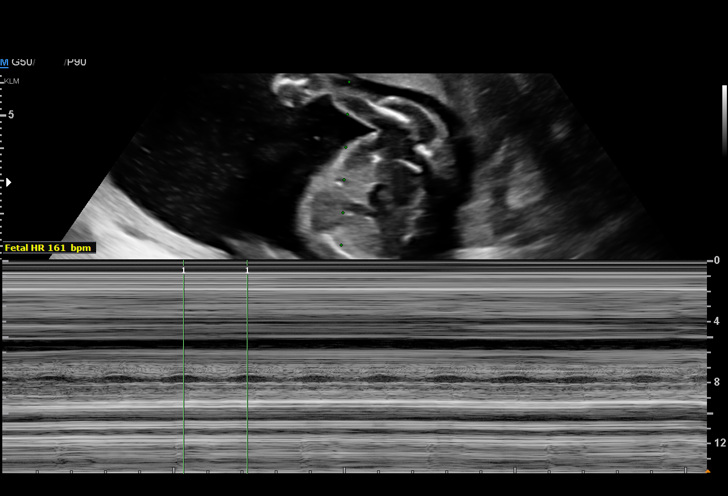
[im 8/65]
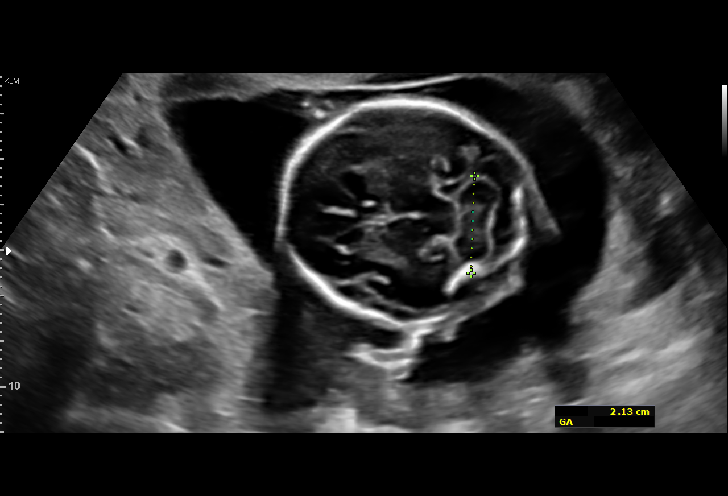
[im 12/65]
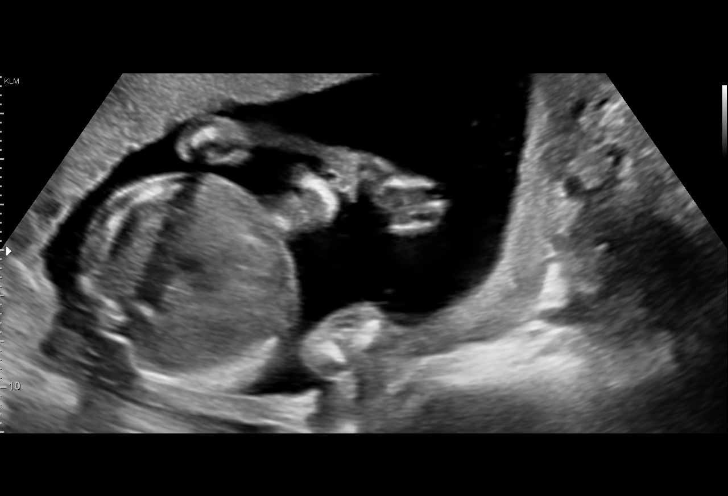
[im 17/65]
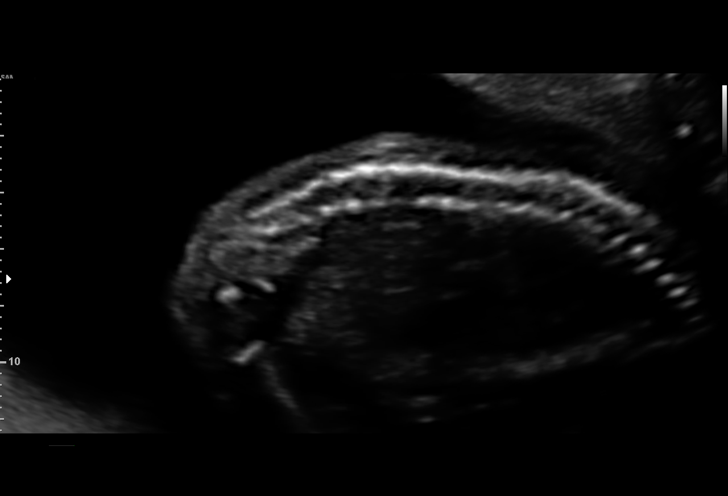
[im 22/65]
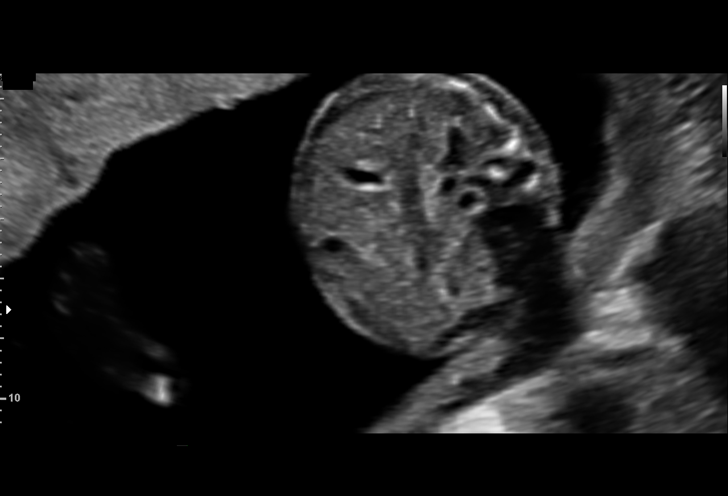
[im 27/65]
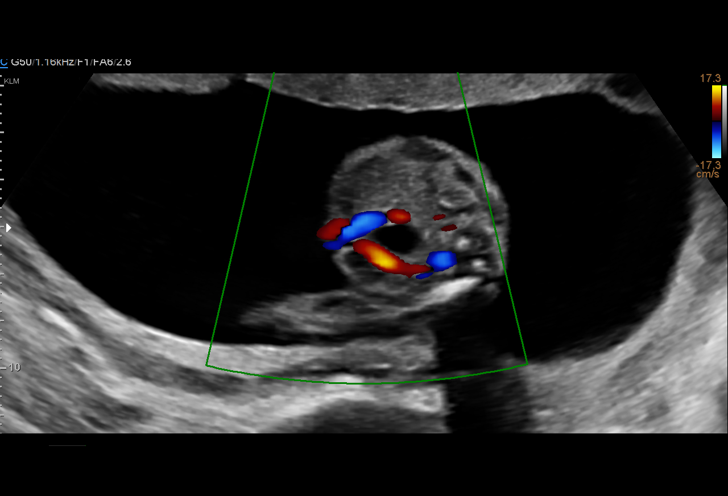
[im 31/65]
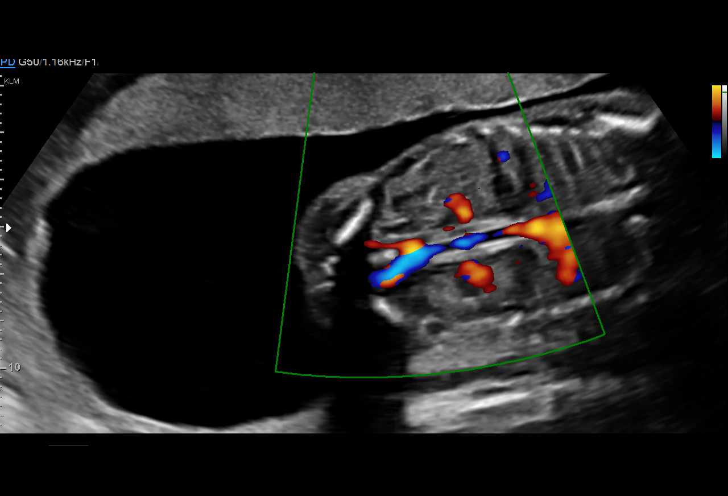
[im 36/65]
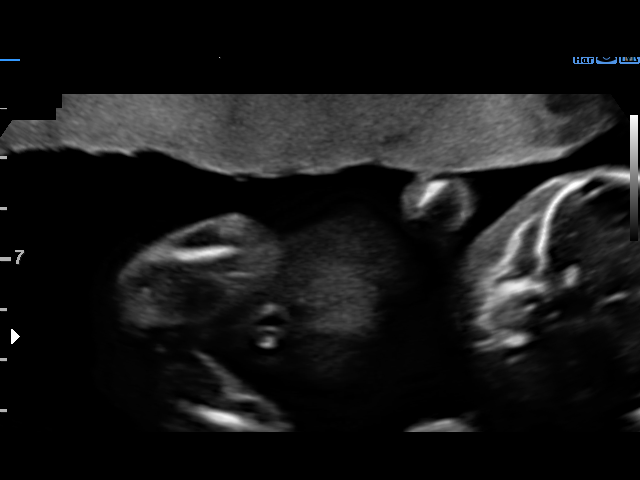
[im 41/65]
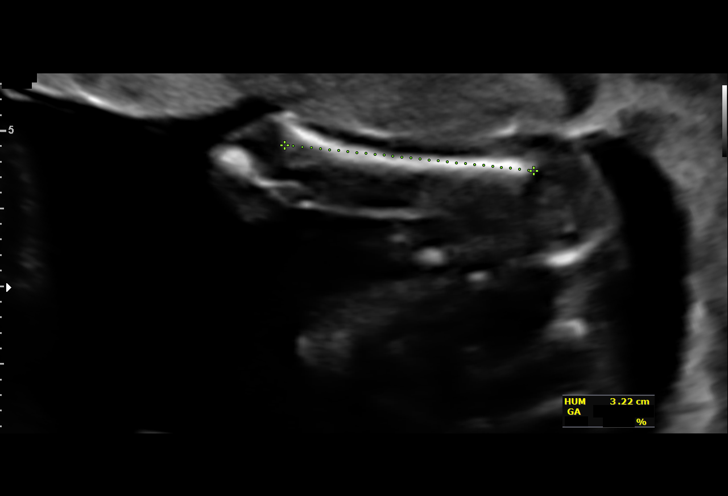
[im 46/65]
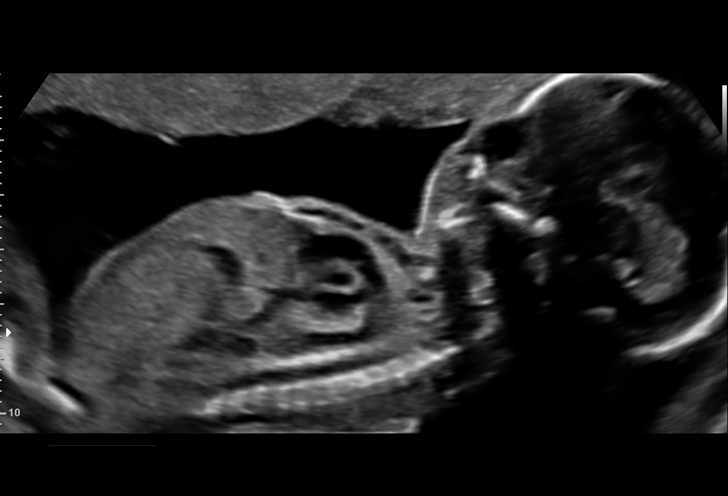
[im 50/65]
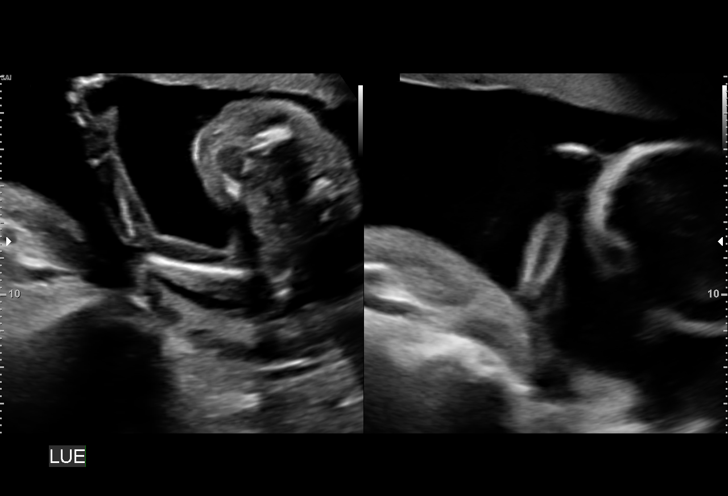
[im 55/65]
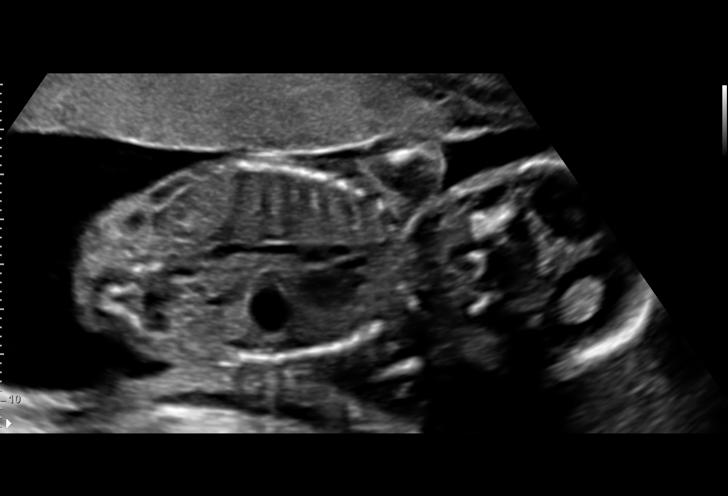
[im 60/65]
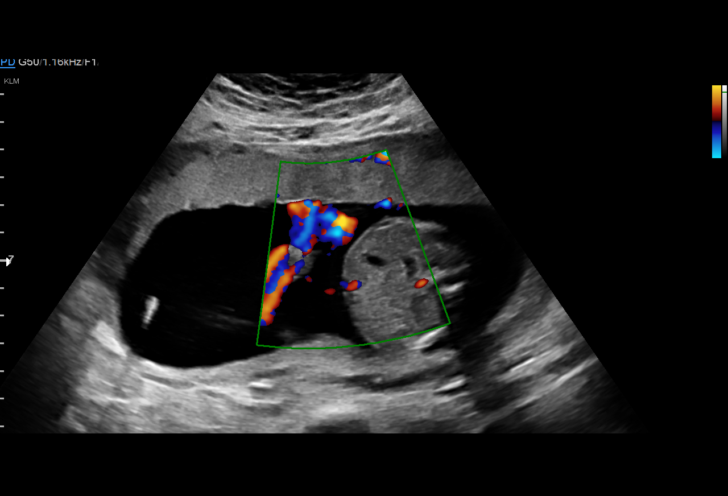
[im 65/65]
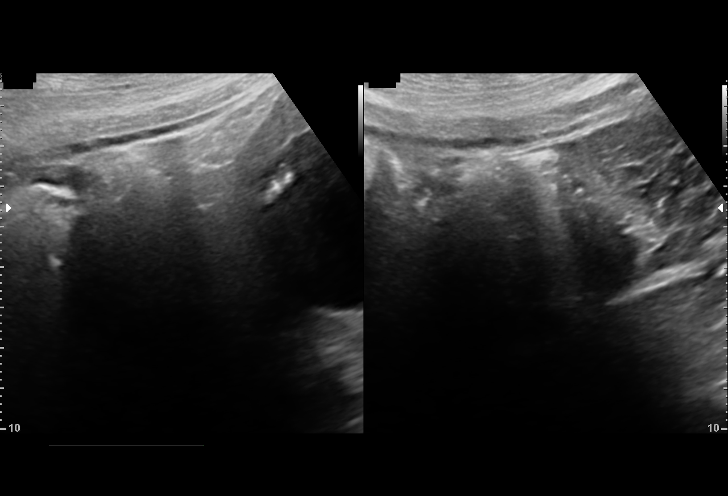

[14 of 28 positions shown; findings below may reference images not displayed]

1  US MFM OB COMP + 14 WK               76805.01     LENNARTH LOCKNER

Indications

Encounter for antenatal screening for
malformations
19 weeks gestation of pregnancy
Thyroid disease in pregnancy ([facility])     O99.280,
Fetal Evaluation

Num Of Fetuses:          1
Fetal Heart Rate(bpm):   161
Cardiac Activity:        Observed
Presentation:            Cephalic
Placenta:                Anterior
P. Cord Insertion:       Visualized

Amniotic Fluid
AFI FV:      Within normal limits
Biometry

BPD:      49.1  mm     G. Age:  20w 6d         96  %    CI:        83.33   %    70 - 86
FL/HC:       19.2  %    16.1 -
HC:      169.6  mm     G. Age:  19w 4d         57  %    HC/AC:       1.09       1.09 -
AC:      155.6  mm     G. Age:  20w 5d         86  %    FL/BPD:      66.2  %
FL:       32.5  mm     G. Age:  20w 1d         73  %    FL/AC:       20.9  %    20 - 24
HUM:      31.4  mm     G. Age:  20w 3d         82  %
CER:      21.3  mm     G. Age:  20w 2d         70  %

Est. FW:     351   gm   0 lb 12 oz      62  %
Gestational Age

LMP:           22w 3d        Date:  04/12/18                 EDD:   01/17/19
U/S Today:     20w 2d                                        EDD:   02/01/19
Best:          19w 2d     Det. By:  Early Ultrasound         EDD:   02/08/19
(07/07/18)
Anatomy

Cranium:               Appears normal         Aortic Arch:            Appears normal
Cavum:                 Appears normal         Ductal Arch:            Appears normal
Ventricles:            Appears normal         Diaphragm:              Appears normal
Choroid Plexus:        Appears normal         Stomach:                Appears normal, left
sided
Cerebellum:            Appears normal         Abdomen:                Appears normal
Posterior Fossa:       Appears normal         Abdominal Wall:         Appears nml (cord
insert, abd wall)
Nuchal Fold:           Appears normal         Cord Vessels:           Appears normal (3
vessel cord)
Face:                  Orbits nl; profile not Kidneys:                Appear normal
well visualized
Lips:                  Appears normal         Bladder:                Appears normal
Thoracic:              Appears normal         Spine:                  Appears normal
Heart:                 Appears normal         Upper Extremities:      Appears normal
(4CH, axis, and situs
RVOT:                  Appears normal         Lower Extremities:      Appears normal
LVOT:                  Appears normal

Other:  Female gender Heels and 5th digit visualized. Nasal bone visualized.
Cervix Uterus Adnexa

Cervix
Length:            3.5  cm.
Normal appearance by transabdominal scan.

Uterus
No abnormality visualized.

Left Ovary
Within normal limits.

Right Ovary
Not visualized. No adnexal mass visualized.

Cul De Sac
No free fluid seen.

Adnexa
No abnormality visualized.
Impression

Normal amniotic fluid volume. Normal interval growth.  No
ultrasonic evidence of structural fetal anomalies.
Recommendations

Follow up as clinically indicated.
